# Patient Record
Sex: Female | Born: 1970 | Race: White | Hispanic: No | Marital: Married | Smoking: Never smoker
Health system: Southern US, Community
[De-identification: ages and names within clinical notes are randomized; demographics above are authoritative.]

## PROBLEM LIST (undated history)

## (undated) DIAGNOSIS — K219 Gastro-esophageal reflux disease without esophagitis: Secondary | ICD-10-CM

## (undated) DIAGNOSIS — N920 Excessive and frequent menstruation with regular cycle: Secondary | ICD-10-CM

## (undated) DIAGNOSIS — F419 Anxiety disorder, unspecified: Secondary | ICD-10-CM

## (undated) DIAGNOSIS — R053 Chronic cough: Secondary | ICD-10-CM

## (undated) DIAGNOSIS — G43909 Migraine, unspecified, not intractable, without status migrainosus: Secondary | ICD-10-CM

## (undated) HISTORY — PX: OTHER SURGICAL HISTORY: SHX169

## (undated) HISTORY — DX: Gastro-esophageal reflux disease without esophagitis: K21.9

## (undated) HISTORY — DX: Migraine, unspecified, not intractable, without status migrainosus: G43.909

---

## 2002-03-06 ENCOUNTER — Encounter: Payer: Self-pay | Admitting: Emergency Medicine

## 2002-03-06 ENCOUNTER — Emergency Department (HOSPITAL_COMMUNITY): Admission: EM | Admit: 2002-03-06 | Discharge: 2002-03-06 | Payer: Self-pay | Admitting: Emergency Medicine

## 2003-03-05 ENCOUNTER — Other Ambulatory Visit: Admission: RE | Admit: 2003-03-05 | Discharge: 2003-03-05 | Payer: Self-pay | Admitting: Obstetrics and Gynecology

## 2003-09-18 ENCOUNTER — Inpatient Hospital Stay (HOSPITAL_COMMUNITY): Admission: AD | Admit: 2003-09-18 | Discharge: 2003-09-20 | Payer: Self-pay | Admitting: Obstetrics and Gynecology

## 2003-09-19 ENCOUNTER — Encounter (INDEPENDENT_AMBULATORY_CARE_PROVIDER_SITE_OTHER): Payer: Self-pay | Admitting: Specialist

## 2003-09-21 ENCOUNTER — Encounter: Admission: RE | Admit: 2003-09-21 | Discharge: 2003-10-21 | Payer: Self-pay | Admitting: Obstetrics and Gynecology

## 2003-10-22 ENCOUNTER — Encounter: Admission: RE | Admit: 2003-10-22 | Discharge: 2003-11-21 | Payer: Self-pay | Admitting: Obstetrics and Gynecology

## 2003-10-24 ENCOUNTER — Other Ambulatory Visit: Admission: RE | Admit: 2003-10-24 | Discharge: 2003-10-24 | Payer: Self-pay | Admitting: Obstetrics and Gynecology

## 2007-08-28 ENCOUNTER — Encounter: Admission: RE | Admit: 2007-08-28 | Discharge: 2007-08-28 | Payer: Self-pay | Admitting: Allergy and Immunology

## 2010-08-12 ENCOUNTER — Encounter
Admission: RE | Admit: 2010-08-12 | Discharge: 2010-08-12 | Payer: Self-pay | Source: Home / Self Care | Attending: Obstetrics and Gynecology | Admitting: Obstetrics and Gynecology

## 2010-09-08 ENCOUNTER — Other Ambulatory Visit: Payer: Self-pay | Admitting: Obstetrics and Gynecology

## 2010-12-04 NOTE — H&P (Signed)
NAMESHAYNA, EBLEN NO.:  [id number]   MEDICAL RECORD NO.:  [id number]                   PATIENT TYPE:  INP   LOCATION:  9173                                 FACILITY:  [facility]   PHYSICIAN:  Lenoard Aden, M.D.             DATE OF BIRTH:  September 26, 1970   DATE OF ADMISSION:  09/18/2003  DATE OF DISCHARGE:                                HISTORY & PHYSICAL   CHIEF COMPLAINT:  Twins with discordant growth at term.   HISTORY OF PRESENT ILLNESS:  Patient is a 40 year old white female G2, P50,  EDD October 07, 2003 at 37 weeks with discordant twin growth and a known  dichorionic-diamniotic twin gestation with a vertex-vertex presenting for  induction.   FAMILY HISTORY:  Past family history remarkable for hypertension, lung and  colon cancer, and breast cancer.   ALLERGIES:  She has no known drug allergies.   MEDICATIONS:  Prenatal vitamins.   OBSTETRICAL HISTORY:  A 9-pound 10-ounce female born in 2001.   PRENATAL LABORATORY DATA:  Blood type of A positive, rubella immune,  hepatitis negative, VDRL nonreactive, GBS is negative.   COMPLICATIONS:  Pregnancy is complicated by twin gestation as previously  noted vertex-vertex presenting with worsening low back pain and some  discordance in growth although appropriate growth in twins are noted.   PHYSICAL EXAMINATION:  GENERAL:  She is a well-developed, well-nourished  white female in no acute distress.  HEENT:  Normal.  LUNGS:  Clear.  HEART:  Regular rate and rhythm.  ABDOMEN:  Soft, gravid, and nontender.  Estimated fetal weights are 7 pounds  on twin A and 6 pounds on twin B.  CERVIX:  Two centimeters, 50%, vertex, and -1.  EXTREMITIES:  No cords.  NEUROLOGIC:  Nonfocal.   IMPRESSION:  Thirty-seven-week twin intrauterine dichorionic/diamniotic  vertex-vertex gestation.   PLAN:  Proceed with induction.  Risks/benefits discussed.  The patient  acknowledges and wishes to proceed.                               Lenoard Aden, M.D.    RJT/MEDQ  D:  09/18/2003  T:  09/18/2003  Job:  [id number]

## 2011-08-24 ENCOUNTER — Ambulatory Visit: Payer: BC Managed Care – PPO | Admitting: Physician Assistant

## 2011-08-24 VITALS — BP 98/72 | HR 62 | Temp 98.7°F | Resp 16 | Ht 65.5 in | Wt 152.4 lb

## 2011-08-24 DIAGNOSIS — G43909 Migraine, unspecified, not intractable, without status migrainosus: Secondary | ICD-10-CM | POA: Insufficient documentation

## 2011-08-24 DIAGNOSIS — G43009 Migraine without aura, not intractable, without status migrainosus: Secondary | ICD-10-CM

## 2011-08-24 DIAGNOSIS — J019 Acute sinusitis, unspecified: Secondary | ICD-10-CM

## 2011-08-24 DIAGNOSIS — R509 Fever, unspecified: Secondary | ICD-10-CM

## 2011-08-24 MED ORDER — FLUTICASONE PROPIONATE 50 MCG/ACT NA SUSP: 2.0000 | Freq: Every day | NASAL | Status: DC

## 2011-08-24 MED ORDER — AMOXICILLIN 500 MG PO CAPS: ORAL_CAPSULE | ORAL | Status: DC

## 2011-08-24 NOTE — Patient Instructions (Signed)
Fluids and rest, Respiratory Care.  Tylenol or Advil if tolerated for fever and body aches. Saline nasal spray

## 2011-08-24 NOTE — Progress Notes (Signed)
  Subjective:    Patient ID: Monica Bennett, female    DOB: 12-05-1970, 41 y.o.   MRN: [id number]  HPI @[facility] @ Dr. Jaymes Graff Dr. Vela Prose is Neurologist 41 y.o. y/o CF presents with URI S/Sx  Onset 5 days ago Location Sinus pressure Duration: 5 days Severity: mild to moderate and worsening Quality/Character:  Pressure in face, Dull aching in upper mandible Exacerbating/Alleviating Factors: Advil  Associated Signs and Symptoms UPPER TEETH PAIN  Status of chronic disease:  Migraines have been more frequent  Smoker: no Alcohol/illicits: no PMH:  Migraines.  Review of Systems  Constitutional: Positive for fever (low grade) and chills.  HENT: Positive for congestion (green nasal drainage), facial swelling and postnasal drip. Negative for ear pain, nosebleeds, neck pain, tinnitus and ear discharge.   Eyes: Negative for photophobia, discharge and itching.  Respiratory: Positive for cough (very little). Negative for chest tightness.   Cardiovascular: Negative.   Gastrointestinal: Negative.   Genitourinary: Negative.   Musculoskeletal: Negative.   Neurological: Positive for headaches (sinus pain).  Psychiatric/Behavioral: Negative.        Objective:   Physical Exam  Nursing note and vitals reviewed. Constitutional: She is oriented to person, place, and time. She appears well-developed and well-nourished. No distress.  HENT:  Head: Normocephalic and atraumatic.  Mouth/Throat: No oropharyngeal exudate.       Turbinate inflammation and swelling  Eyes: Conjunctivae and EOM are normal. Pupils are equal, round, and reactive to light.  Neck: Normal range of motion. Neck supple. No thyromegaly present.  Cardiovascular: Normal rate, regular rhythm and normal heart sounds.  Exam reveals no gallop and no friction rub.   No murmur heard. Pulmonary/Chest: Effort normal and breath sounds normal.  Abdominal: Soft.  Lymphadenopathy:    She has no cervical adenopathy.  Neurological: She  is alert and oriented to person, place, and time. No cranial nerve deficit.  Skin: Skin is warm and dry. No rash noted.  Psychiatric: She has a normal mood and affect.          Assessment & Plan:   Sinusitis Exacerbation of Migraines-? Sinus inflammation See AVS.  Amoxicillin, Flonase, cold air humidifier.  Continue flonase after illness is resolved to see if it will reduce number of migraines and schedule appt with Dr. Vela Prose.

## 2011-09-17 ENCOUNTER — Other Ambulatory Visit: Payer: Self-pay | Admitting: Obstetrics and Gynecology

## 2011-09-17 DIAGNOSIS — Z1231 Encounter for screening mammogram for malignant neoplasm of breast: Secondary | ICD-10-CM

## 2011-09-27 ENCOUNTER — Ambulatory Visit
Admission: RE | Admit: 2011-09-27 | Discharge: 2011-09-27 | Disposition: A | Discharge status: Home / Self Care | Payer: BC Managed Care – PPO | Source: Ambulatory Visit | Attending: Obstetrics and Gynecology | Admitting: Obstetrics and Gynecology

## 2011-09-27 DIAGNOSIS — Z1231 Encounter for screening mammogram for malignant neoplasm of breast: Secondary | ICD-10-CM

## 2012-04-23 ENCOUNTER — Ambulatory Visit (INDEPENDENT_AMBULATORY_CARE_PROVIDER_SITE_OTHER): Payer: BC Managed Care – PPO | Admitting: Internal Medicine

## 2012-04-23 VITALS — BP 105/75 | HR 83 | Temp 98.2°F | Resp 16 | Ht 66.0 in | Wt 147.6 lb

## 2012-04-23 DIAGNOSIS — J029 Acute pharyngitis, unspecified: Secondary | ICD-10-CM

## 2012-04-23 DIAGNOSIS — R070 Pain in throat: Secondary | ICD-10-CM

## 2012-04-23 LAB — POCT RAPID STREP A (OFFICE): Rapid Strep A Screen: POSITIVE — AB

## 2012-04-23 MED ORDER — AMOXICILLIN 500 MG PO CAPS: 1000.0000 mg | ORAL_CAPSULE | Freq: Two times a day (BID) | ORAL | Status: DC

## 2012-04-23 MED ORDER — HYDROCODONE-ACETAMINOPHEN 7.5-500 MG/15ML PO SOLN: 5.0000 mL | Freq: Four times a day (QID) | ORAL | Status: DC | PRN

## 2012-04-23 NOTE — Progress Notes (Signed)
  Subjective:    Patient ID: Monica Bennett, female    DOB: 01/01/1971, 41 y.o.   MRN: [id number]  HPI Abrupt onset of st, no chest sxs Son has strep   Review of Systems     Objective:   Physical Exam Red swollen tonsils Lungs clear Nose clear  Results for orders placed in visit on 04/23/12  POCT RAPID STREP A (OFFICE)      Component Value Range   Rapid Strep A Screen Positive (*) Negative        Assessment & Plan:  Strep tonsillitis

## 2012-04-23 NOTE — Patient Instructions (Signed)

## 2012-06-02 ENCOUNTER — Ambulatory Visit: Payer: BC Managed Care – PPO | Admitting: Emergency Medicine

## 2012-06-02 VITALS — BP 142/82 | HR 70 | Temp 98.4°F | Resp 17 | Ht 67.0 in | Wt 152.0 lb

## 2012-06-02 DIAGNOSIS — G43909 Migraine, unspecified, not intractable, without status migrainosus: Secondary | ICD-10-CM

## 2012-06-02 DIAGNOSIS — R51 Headache: Secondary | ICD-10-CM

## 2012-06-02 MED ORDER — KETOROLAC TROMETHAMINE 60 MG/2ML IM SOLN
60.0000 mg | Freq: Once | INTRAMUSCULAR | Status: AC
  Administered 2012-06-02: 60 mg via INTRAMUSCULAR

## 2012-06-02 MED ORDER — ONDANSETRON 4 MG PO TBDP
4.0000 mg | ORAL_TABLET | Freq: Once | ORAL | Status: AC
  Administered 2012-06-02: 8 mg via ORAL

## 2012-06-02 NOTE — Patient Instructions (Addendum)
Migraine Headache A migraine headache is an intense, throbbing pain on one or both sides of your head. A migraine can last for 30 minutes to several hours. CAUSES  The exact cause of a migraine headache is not always known. However, a migraine may be caused when nerves in the brain become irritated and release chemicals that cause inflammation. This causes pain. SYMPTOMS  Pain on one or both sides of your head.  Pulsating or throbbing pain.  Severe pain that prevents daily activities.  Pain that is aggravated by any physical activity.  Nausea, vomiting, or both.  Dizziness.  Pain with exposure to bright lights, loud noises, or activity.  General sensitivity to bright lights, loud noises, or smells. Before you get a migraine, you may get warning signs that a migraine is coming (aura). An aura may include:  Seeing flashing lights.  Seeing bright spots, halos, or zig-zag lines.  Having tunnel vision or blurred vision.  Having feelings of numbness or tingling.  Having trouble talking.  Having muscle weakness. MIGRAINE TRIGGERS  Alcohol.  Smoking.  Stress.  Menstruation.  Aged cheeses.  Foods or drinks that contain nitrates, glutamate, aspartame, or tyramine.  Lack of sleep.  Chocolate.  Caffeine.  Hunger.  Physical exertion.  Fatigue.  Medicines used to treat chest pain (nitroglycerine), birth control pills, estrogen, and some blood pressure medicines. DIAGNOSIS  A migraine headache is often diagnosed based on:  Symptoms.  Physical examination.  A CT scan or MRI of your head. TREATMENT Medicines may be given for pain and nausea. Medicines can also be given to help prevent recurrent migraines.  HOME CARE INSTRUCTIONS  Only take over-the-counter or prescription medicines for pain or discomfort as directed by your caregiver. The use of long-term narcotics is not recommended.  Lie down in a dark, quiet room when you have a migraine.  Keep a journal  to find out what may trigger your migraine headaches. For example, write down:  What you eat and drink.  How much sleep you get.  Any change to your diet or medicines.  Limit alcohol consumption.  Quit smoking if you smoke.  Get 7 to 9 hours of sleep, or as recommended by your caregiver.  Limit stress.  Keep lights dim if bright lights bother you and make your migraines worse. SEEK IMMEDIATE MEDICAL CARE IF:   Your migraine becomes severe.  You have a fever.  You have a stiff neck.  You have vision loss.  You have muscular weakness or loss of muscle control.  You start losing your balance or have trouble walking.  You feel faint or pass out.  You have severe symptoms that are different from your first symptoms. MAKE SURE YOU:   Understand these instructions.  Will watch your condition.  Will get help right away if you are not doing well or get worse. Document Released: 07/05/2005 Document Revised: 09/27/2011 Document Reviewed: 06/25/2011 [employer] Patient Information 2013 [employer].  

## 2012-06-02 NOTE — Progress Notes (Signed)
  Subjective:    Patient ID: Monica Bennett, female    DOB: 21-Nov-1970, 41 y.o.   MRN: [id number]  HPI  41 y.o female presents with migraine that started the beginning of the month of November, progressively getting worse .  Has history of migraines in the past and normally resolves with Imitrex.  Onset due to hormonal reasons.  Has imitrex at her house but husband is out of town and she was weary of giving it to herself.  No nausea or vomiting.  Pain located bilateral temporals and going down both sides of neck.  Review of Systems     Objective:   Physical Exam pupils equal round regular react to light direct and consensual. Extraocular movements are intact. Cranial nerves II through XII are intact. The neck is supple. The chest is clear. Deep tendon reflexes are 2+ and symmetrical motor strength 5 out of 5 all muscle groups. The neck is supple.        Assessment & Plan:  Patient with a long history of hormonal induced migraines. Here with a three-day history of bitemporal headache not associated with much nausea. She's had significant nausea with headaches in the past. She normally takes an Imitrex injection but her husband is out of town so she was unable to administer this. Toradol and Zofran in the past

## 2012-10-05 ENCOUNTER — Ambulatory Visit: Payer: BC Managed Care – PPO | Admitting: Family Medicine

## 2012-10-05 VITALS — BP 116/84 | HR 76 | Temp 98.0°F | Resp 16 | Ht 66.0 in | Wt 151.0 lb

## 2012-10-05 DIAGNOSIS — G43909 Migraine, unspecified, not intractable, without status migrainosus: Secondary | ICD-10-CM

## 2012-10-05 MED ORDER — ONDANSETRON 4 MG PO TBDP
8.0000 mg | ORAL_TABLET | Freq: Once | ORAL | Status: AC
  Administered 2012-10-05: 8 mg via ORAL

## 2012-10-05 MED ORDER — KETOROLAC TROMETHAMINE 60 MG/2ML IM SOLN
60.0000 mg | Freq: Once | INTRAMUSCULAR | Status: AC
  Administered 2012-10-05: 60 mg via INTRAMUSCULAR

## 2012-10-05 MED ORDER — ONDANSETRON HCL 8 MG PO TABS: 8.0000 mg | ORAL_TABLET | Freq: Three times a day (TID) | ORAL | Status: DC | PRN

## 2012-10-05 NOTE — Patient Instructions (Signed)
Let us know if your headache comes back- feel free to call or come back in if needed

## 2012-10-05 NOTE — Progress Notes (Signed)
[facility] [address] [place] [address] [phone]  Date:  10/05/2012   Name:  Monica Bennett   DOB:  1971/07/04   MRN:  [id number]  PCP:  Anne Ng    Chief Complaint: Migraine   History of Present Illness:  Monica Bennett is a 42 y.o. very pleasant female patient who presents with the following:  Here today with complaint of migraine HA.  Last here in November 2013 with complaint of migraine.  She was treated with zofran and a toradol injection.  Tends to have migraine HA prior to her menses which are due in a few days.  Her current HA started today.  Her HA is located in the frontal part of her head, and in her neck.  She does have nausea, but no vomiting as of yet.   She does not get aura. She notes mild blurred vison, but does not have her glasses on right now and thinks this is the cause.   At her last visit the toradol did help She took some sort of rx medication earlier today- caled Dr. Clarisse Gouge, it was parafon forte, which she uses prn for migraine HA.  She has not taken any other medications or NSAIDs today  She has not had any zofran yet.  She does have phenergan at home but has not taken it- it makes her too sleepy.  She would like to have some zofran for home if possible Her LMP was 09/11/12.  She brought herself here today- her husband is OOT, so he was not here to give her her imitrex shots.   Patient Active Problem List  Diagnosis  . Migraine    Past Medical History  Diagnosis Date  . Migraines     No past surgical history on file.  History  Substance Use Topics  . Smoking status: Never Smoker   . Smokeless tobacco: Not on file  . Alcohol Use: No    Family History  Problem Relation Age of Onset  . Cancer Maternal Grandmother   . Heart disease Maternal Grandfather   . Cancer Paternal Grandmother   . Cancer Paternal Grandfather     No Known Allergies  Medication list has been reviewed and updated.  Current  Outpatient Prescriptions on File Prior to Visit  Medication Sig Dispense Refill  . calcium carbonate (OS-CAL) 1250 MG chewable tablet Chew 1 tablet by mouth daily.      Marland Kitchen topiramate (TOPAMAX) 25 MG tablet Take 75 mg by mouth 3 (three) times daily.       Marland Kitchen amoxicillin (AMOXIL) 500 MG capsule Take 2 po bid  40 capsule  0  . amoxicillin (AMOXIL) 500 MG capsule Take 2 capsules (1,000 mg total) by mouth 2 (two) times daily.  40 capsule  0  . fluticasone (FLONASE) 50 MCG/ACT nasal spray Place 2 sprays into the nose daily.  16 g  6  . HYDROcodone-acetaminophen (LORTAB) 7.5-500 MG/15ML solution Take 5 mLs by mouth every 6 (six) hours as needed for pain.  240 mL  0   No current facility-administered medications on file prior to visit.    Review of Systems:  As per HPI- otherwise negative. She describes this as a typical migraine HA today  Physical Examination: Filed Vitals:   10/05/12 0811  BP: 116/84  Pulse: 76  Temp: 98 F (36.7 C)  Resp: 16   Filed Vitals:   10/05/12 0811  Height: 5\' 6"  (1.676 m)  Weight: 151 lb (  68.493 kg)   Body mass index is 24.38 kg/(m^2). Ideal Body Weight: Weight in (lb) to have BMI = 25: 154.6  GEN: WDWN, NAD, Non-toxic, A & O x 3, looks miserable prior to treatment.   HEENT: Atraumatic, Normocephalic. Neck supple. No masses, No LAD. Bilateral TM wnl, oropharynx normal.  PEERL,EOMI.   Ears and Nose: No external deformity. CV: RRR, No M/G/R. No JVD. No thrill. No extra heart sounds. PULM: CTA B, no wheezes, crackles, rhonchi. No retractions. No resp. distress. No accessory muscle use. ABD: S, NT, ND, +BS. No rebound. No HSM. EXTR: No c/c/e NEURO Normal gait. Normal strength, sensation and DTR of all extremities.  No facial droop.  Negative arm drift test.  PSYCH: Normally interactive. Conversant. Not depressed or anxious appearing.  Calm demeanor.   Cohen felt a lot better after her zofran and toradol IM.  Her pain was reduced and she felt able to drive  herself home.  She appeared much better and to be out of pain  Assessment and Plan: Migraine headache - Plan: ondansetron (ZOFRAN-ODT) disintegrating tablet 8 mg, ketorolac (TORADOL) injection 60 mg, ondansetron (ZOFRAN) 8 MG tablet  Recurrent migraine HA, treated as above with zofran and toradol.  Gave an rx for zofran for her to take home as well.  She will call or come back in if her HA does not continue to remit or if it comes back- Sooner if worse.     Signed Abbe Amsterdam, MD

## 2012-10-25 ENCOUNTER — Other Ambulatory Visit: Payer: Self-pay

## 2012-10-25 DIAGNOSIS — Z1231 Encounter for screening mammogram for malignant neoplasm of breast: Secondary | ICD-10-CM

## 2012-11-28 ENCOUNTER — Ambulatory Visit
Admission: RE | Admit: 2012-11-28 | Discharge: 2012-11-28 | Disposition: A | Discharge status: Home / Self Care | Payer: BC Managed Care – PPO | Source: Ambulatory Visit

## 2012-11-28 DIAGNOSIS — Z1231 Encounter for screening mammogram for malignant neoplasm of breast: Secondary | ICD-10-CM

## 2014-04-17 ENCOUNTER — Ambulatory Visit (INDEPENDENT_AMBULATORY_CARE_PROVIDER_SITE_OTHER): Payer: BC Managed Care – PPO | Admitting: Family Medicine

## 2014-04-17 VITALS — BP 120/78 | HR 77 | Temp 97.8°F | Resp 16 | Ht 66.0 in | Wt 160.2 lb

## 2014-04-17 DIAGNOSIS — K219 Gastro-esophageal reflux disease without esophagitis: Secondary | ICD-10-CM

## 2014-04-17 MED ORDER — ESOMEPRAZOLE MAGNESIUM 40 MG PO CPDR: DELAYED_RELEASE_CAPSULE | ORAL | Status: DC

## 2014-04-17 NOTE — Patient Instructions (Signed)
We will change your PPI to a stronger form (esomeprazole) to take once daily for 14 days, then as needed. If your symptoms persist, I would follow-up with your Gastroenterologist or here at our clinic for consideration of H. Pylori testing and/or EGD (a camera to look at the lining of the stomach). If you begin to have vomiting, coffee ground vomiting, fever, chills, dizziness, black tarry stools, then return to clinic immediately or go to the ED.  Gastroesophageal Reflux Disease, Adult Gastroesophageal reflux disease (GERD) happens when acid from your stomach flows up into the esophagus. When acid comes in contact with the esophagus, the acid causes soreness (inflammation) in the esophagus. Over time, GERD may create small holes (ulcers) in the lining of the esophagus. CAUSES   Increased body weight. This puts pressure on the stomach, making acid rise from the stomach into the esophagus.  Smoking. This increases acid production in the stomach.  Drinking alcohol. This causes decreased pressure in the lower esophageal sphincter (valve or ring of muscle between the esophagus and stomach), allowing acid from the stomach into the esophagus.  Late evening meals and a full stomach. This increases pressure and acid production in the stomach.  A malformed lower esophageal sphincter. Sometimes, no cause is found. SYMPTOMS   Burning pain in the lower part of the mid-chest behind the breastbone and in the mid-stomach area. This may occur twice a week or more often.  Trouble swallowing.  Sore throat.  Dry cough.  Asthma-like symptoms including chest tightness, shortness of breath, or wheezing. DIAGNOSIS  Your caregiver may be able to diagnose GERD based on your symptoms. In some cases, X-rays and other tests may be done to check for complications or to check the condition of your stomach and esophagus. TREATMENT  Your caregiver may recommend over-the-counter or prescription medicines to help  decrease acid production. Ask your caregiver before starting or adding any new medicines.  HOME CARE INSTRUCTIONS   Change the factors that you can control. Ask your caregiver for guidance concerning weight loss, quitting smoking, and alcohol consumption.  Avoid foods and drinks that make your symptoms worse, such as:  Caffeine or alcoholic drinks.  Chocolate.  Peppermint or mint flavorings.  Garlic and onions.  Spicy foods.  Citrus fruits, such as oranges, lemons, or limes.  Tomato-based foods such as sauce, chili, salsa, and pizza.  Fried and fatty foods.  Avoid lying down for the 3 hours prior to your bedtime or prior to taking a nap.  Eat small, frequent meals instead of large meals.  Wear loose-fitting clothing. Do not wear anything tight around your waist that causes pressure on your stomach.  Raise the head of your bed 6 to 8 inches with wood blocks to help you sleep. Extra pillows will not help.  Only take over-the-counter or prescription medicines for pain, discomfort, or fever as directed by your caregiver.  Do not take aspirin, ibuprofen, or other nonsteroidal anti-inflammatory drugs (NSAIDs). SEEK IMMEDIATE MEDICAL CARE IF:   You have pain in your arms, neck, jaw, teeth, or back.  Your pain increases or changes in intensity or duration.  You develop nausea, vomiting, or sweating (diaphoresis).  You develop shortness of breath, or you faint.  Your vomit is green, yellow, black, or looks like coffee grounds or blood.  Your stool is red, bloody, or black. These symptoms could be signs of other problems, such as heart disease, gastric bleeding, or esophageal bleeding. MAKE SURE YOU:   Understand these instructions.  Will watch your condition.  Will get help right away if you are not doing well or get worse. Document Released: 04/14/2005 Document Revised: 09/27/2011 Document Reviewed: 01/22/2011 [facility] Patient Information 2015 [place], [place]. This  information is not intended to replace advice given to you by your health care provider. Make sure you discuss any questions you have with your health care provider.

## 2014-04-17 NOTE — Progress Notes (Signed)
   Subjective:    Patient ID: Monica Bennett, female    DOB: 10-11-1970, 43 y.o.   MRN: [id number]  HPI Monica Bennett is a 43 year old female who presents with chronic cough and sore throat. Onset of symptoms was about 1 month ago. Character of cough is dry, non-productive. Little relief noted with vinegar, honey, OTC anti-reflux medications- prilosec 20mg  once daily. Aggravated with stress and carbonated foods, laying down. Recent hx of a cold 3 weeks ago. Hx of GERD. She is UTD on colonoscopy, but due for a 5 yr follow-up for polyps soon.  Dietary Habits: Tries to avoid trigger foods, limit coffee and soda.  ROS: +Occasional nausea, but no vomiting +GERD No melanotic stools or change in stools No sinus pressure No rhinitis No Wheezing No SOB No Sneezing No Allergies -->saw allergist, no allergies No Swelling No Chest Pain No Fatigue No Unexplained Wt loss   SocHx: Works a Designer, multimedia. Likes to bike and walk (no exercise intolerance). No tobacco use. Occ alcohol use FamHx: Brother with GERD as well-> has had EGD which was normal.  Review of Systems 11 point review of systems was performed and was otherwise negative unless noted in the history of present illness.    Objective:   Physical Exam BP 120/78  Pulse 77  Temp(Src) 97.8 F (36.6 C) (Oral)  Resp 16  Ht 5\' 6"  (1.676 m)  Wt 160 lb 3.2 oz (72.666 kg)  BMI 25.87 kg/m2  SpO2 98%  LMP 04/04/2014 General appearance: alert, cooperative and appears stated age Head: Normocephalic, without obvious abnormality, atraumatic Eyes: conjunctivae/corneas clear. PERRL, EOM's intact. Fundi benign., no icterus or pallor Ears: normal TM's and external ear canals both ears Nose: Nares normal. Septum midline. Mucosa normal. No drainage or sinus tenderness. Throat: mild posterior pharyngeal erythema, no acid signs on teeth, no tonsillar exudates Neck: no adenopathy, no carotid bruit, no JVD, supple, symmetrical, trachea midline and thyroid not  enlarged, symmetric, no tenderness/mass/nodules Lungs: clear to auscultation bilaterally and normal percussion bilaterally Heart: regular rate and rhythm, S1, S2 normal, no murmur, click, rub or gallop Abdomen: soft, non-tender; bowel sounds normal; no masses,  no organomegaly Skin: Skin color, texture, turgor normal. No rashes or lesions Lymph nodes: Cervical, supraclavicular, and axillary nodes normal.    Assessment & Plan:  1. GERD  -Change PPI to a stronger form (esomeprazole) to take once daily for 14 days, then as needed. -If symptoms persist, I would recommend she follow-up with her Gastroenterologist or here at our clinic for consideration of H. Pylori testing and/or EGD (a camera to look at the lining of the stomach). -If you begin to have vomiting, coffee ground vomiting, fever, chills, dizziness, black tarry stools, then return to clinic immediately or go to the ED. Patient verbalized understanding and agreement.  Dr. Linnell Fulling, DO Sports Medicine Fellow

## 2014-07-23 ENCOUNTER — Other Ambulatory Visit: Payer: Self-pay

## 2014-07-23 DIAGNOSIS — Z1231 Encounter for screening mammogram for malignant neoplasm of breast: Secondary | ICD-10-CM

## 2014-08-09 ENCOUNTER — Ambulatory Visit: Payer: Self-pay

## 2014-08-23 ENCOUNTER — Ambulatory Visit: Payer: Self-pay

## 2015-05-30 ENCOUNTER — Ambulatory Visit (INDEPENDENT_AMBULATORY_CARE_PROVIDER_SITE_OTHER): Payer: Self-pay | Admitting: Family Medicine

## 2015-05-30 VITALS — BP 116/76 | HR 78 | Temp 98.6°F | Resp 18 | Ht 66.0 in | Wt 171.0 lb

## 2015-05-30 DIAGNOSIS — R05 Cough: Secondary | ICD-10-CM

## 2015-05-30 DIAGNOSIS — R059 Cough, unspecified: Secondary | ICD-10-CM

## 2015-05-30 DIAGNOSIS — K219 Gastro-esophageal reflux disease without esophagitis: Secondary | ICD-10-CM

## 2015-05-30 MED ORDER — HYDROCOD POLST-CPM POLST ER 10-8 MG/5ML PO SUER: 5.0000 mL | Freq: Two times a day (BID) | ORAL | Status: DC | PRN

## 2015-05-30 NOTE — Progress Notes (Signed)
@[facility]FCLOGO @  This chart was scribed for Monica Haber, MD by Monica Bennett, ED Scribe. This patient was seen in room 3 and the patient's care was started at 8:16 AM.  Patient ID: Monica Bennett MRN: [id number], DOB: 1970-12-19, 44 y.o. Date of Encounter: 05/30/2015, 8:16 AM  Primary Physician: Mikey Kirschner  Chief Complaint:  Chief Complaint  Patient presents with   Cough    has appt with gastro in 2 weeks    Gastroesophageal Reflux    HPI: 44 y.o. year old female with history below presents with cough from GERD. States she is usually able to get cough under control but has been coughing for 6 weeks. Cough was more productive with phlegm a couple days ago. Symptoms are worse at night and with talking. She has tried OTC cough syrup without relief. She also takes nexium 20mg  BID, and takes one at least once a day. Has an appointment with Gertie Fey, Dr. Benson Norway in 2 weeks.  Pt works at [employer].   Past Medical History  Diagnosis Date   Migraines    GERD (gastroesophageal reflux disease)      Home Meds: Prior to Admission medications   Medication Sig Start Date End Date Taking? Authorizing Provider  calcium carbonate (OS-CAL) 1250 MG chewable tablet Chew 1 tablet by mouth daily.   Yes Historical Provider, MD  esomeprazole (NEXIUM) 40 MG capsule Take once daily for 14 days, then as needed. 04/17/14  Yes John C Pick-Jacobs, DO  levonorgestrel (MIRENA) 20 MCG/24HR IUD 1 each by Intrauterine route once.   Yes Historical Provider, MD  chlorzoxazone (PARAFON) 500 MG tablet Take 500 mg by mouth 4 (four) times daily as needed for muscle spasms (as needed for muscle spasm).    Historical Provider, MD  topiramate (TOPAMAX) 100 MG tablet Take 100 mg by mouth daily.    Historical Provider, MD  topiramate (TOPAMAX) 25 MG tablet Take 75 mg by mouth 3 (three) times daily.     Historical Provider, MD    Allergies: No Known Allergies  Social History   Social History   Marital  Status: Married    Spouse Name: N/A   Number of Children: N/A   Years of Education: N/A   Occupational History   Not on file.   Social History Main Topics   Smoking status: Never Smoker    Smokeless tobacco: Not on file   Alcohol Use: No   Drug Use: No   Sexual Activity: Yes    Museum/gallery curator: None   Other Topics Concern   Not on file   Social History Narrative    Review of Systems: Constitutional: negative for chills, fever, night sweats, weight changes, or fatigue  HEENT: negative for vision changes, hearing loss, congestion, rhinorrhea, ST, epistaxis, or sinus pressure Cardiovascular: negative for chest pain or palpitations Respiratory: negative for hemoptysis, wheezing or shortness of breath. Abdominal: negative for abdominal pain, nausea, vomiting, diarrhea, or constipation Dermatological: negative for rash Neurologic: negative for headache, dizziness, or syncope All other systems reviewed and are otherwise negative with the exception to those above and in the HPI.   Physical Exam: Blood pressure 116/76, pulse 78, temperature 98.6 F (37 C), temperature source Oral, resp. rate 18, weight 171 lb (77.565 kg), SpO2 98 %., Body mass index is 27.61 kg/(m^2). General: Well developed, well nourished, in no acute distress. Head: Normocephalic, atraumatic, eyes without discharge, sclera non-icteric, nares are without discharge. Bilateral auditory canals clear, TM's are without perforation, pearly grey and  translucent with reflective cone of light bilaterally. Oral cavity moist, posterior pharynx without exudate, erythema, peritonsillar abscess, or post nasal drip.  Neck: Supple. No thyromegaly. Full ROM. No lymphadenopathy. Lungs: Clear bilaterally to auscultation without wheezes, rales, or rhonchi. Breathing is unlabored. Heart: RRR with S1 S2. No murmurs, rubs, or gallops appreciated. Abdomen: Soft, non-tender, non-distended with normoactive bowel sounds. No  hepatomegaly. No rebound/guarding. No obvious abdominal masses. Msk:  Strength and tone normal for age. Extremities/Skin: Warm and dry. No clubbing or cyanosis. No edema. No rashes or suspicious lesions. Neuro: Alert and oriented X 3. Moves all extremities spontaneously. Gait is normal. CNII-XII grossly in tact. Psych:  Responds to questions appropriately with a normal affect.    ASSESSMENT AND PLAN:  44 y.o. year old female with  This chart was scribed in my presence and reviewed by me personally.    ICD-9-CM ICD-10-CM   1. Cough 786.2 R05 chlorpheniramine-HYDROcodone (TUSSIONEX PENNKINETIC ER) 10-8 MG/5ML SUER  2. Gastroesophageal reflux disease without esophagitis 530.81 K21.9 chlorpheniramine-HYDROcodone (TUSSIONEX PENNKINETIC ER) 10-8 MG/5ML SUER   The initial illness precipitated reactive airways and with the patient's history of cough related GERD, continued. She'll continue the Nexium and add a cough medicine at night for now.     By signing my name below, I, Raven Small, attest that this documentation has been prepared under the direction and in the presence of Monica Haber, MD.  Electronically Signed: Thea Bennett, ED Scribe. 05/30/2015. 8:24 AM.  Signed, Monica Haber, MD 05/30/2015 8:16 AM

## 2015-05-30 NOTE — Patient Instructions (Signed)

## 2015-11-22 DIAGNOSIS — Z9289 Personal history of other medical treatment: Secondary | ICD-10-CM | POA: Diagnosis not present

## 2015-11-22 DIAGNOSIS — J029 Acute pharyngitis, unspecified: Secondary | ICD-10-CM | POA: Diagnosis not present

## 2016-02-16 DIAGNOSIS — M67911 Unspecified disorder of synovium and tendon, right shoulder: Secondary | ICD-10-CM | POA: Diagnosis not present

## 2016-05-19 DIAGNOSIS — Z23 Encounter for immunization: Secondary | ICD-10-CM | POA: Diagnosis not present

## 2016-10-06 DIAGNOSIS — Z713 Dietary counseling and surveillance: Secondary | ICD-10-CM | POA: Diagnosis not present

## 2016-12-01 DIAGNOSIS — Z713 Dietary counseling and surveillance: Secondary | ICD-10-CM | POA: Diagnosis not present

## 2017-03-17 DIAGNOSIS — J029 Acute pharyngitis, unspecified: Secondary | ICD-10-CM | POA: Diagnosis not present

## 2017-03-17 DIAGNOSIS — Z9289 Personal history of other medical treatment: Secondary | ICD-10-CM | POA: Diagnosis not present

## 2017-05-28 DIAGNOSIS — Z23 Encounter for immunization: Secondary | ICD-10-CM | POA: Diagnosis not present

## 2017-09-30 ENCOUNTER — Encounter: Payer: Self-pay | Admitting: Physician Assistant

## 2017-09-30 ENCOUNTER — Other Ambulatory Visit: Payer: Self-pay

## 2017-09-30 ENCOUNTER — Ambulatory Visit (INDEPENDENT_AMBULATORY_CARE_PROVIDER_SITE_OTHER): Payer: BLUE CROSS/BLUE SHIELD | Admitting: Physician Assistant

## 2017-09-30 VITALS — BP 110/86 | HR 79 | Temp 98.4°F | Resp 16 | Ht 66.0 in | Wt 147.0 lb

## 2017-09-30 DIAGNOSIS — R112 Nausea with vomiting, unspecified: Secondary | ICD-10-CM | POA: Diagnosis not present

## 2017-09-30 DIAGNOSIS — G43011 Migraine without aura, intractable, with status migrainosus: Secondary | ICD-10-CM

## 2017-09-30 MED ORDER — KETOROLAC TROMETHAMINE 60 MG/2ML IM SOLN
60.0000 mg | Freq: Once | INTRAMUSCULAR | Status: AC
  Administered 2017-09-30: 60 mg via INTRAMUSCULAR

## 2017-09-30 MED ORDER — ONDANSETRON 8 MG PO TBDP: 8.0000 mg | ORAL_TABLET | Freq: Three times a day (TID) | ORAL | 0 refills | Status: DC | PRN

## 2017-09-30 MED ORDER — ONDANSETRON HCL 4 MG/2ML IJ SOLN
2.0000 mg | Freq: Once | INTRAMUSCULAR | Status: AC
  Administered 2017-09-30: 2 mg via INTRAMUSCULAR

## 2017-09-30 MED ORDER — PROMETHAZINE HCL 25 MG/ML IJ SOLN: 50.0000 mg | Freq: Once | INTRAMUSCULAR | Status: DC

## 2017-09-30 MED ORDER — SUMATRIPTAN 5 MG/ACT NA SOLN
1.0000 | NASAL | 0 refills | Status: DC | PRN
Start: 2017-09-30 — End: 2020-09-17

## 2017-09-30 NOTE — Progress Notes (Signed)
Monica Bennett  MRN: [id number] DOB: [birth year]  PCP: Patient, No Pcp Per  Subjective:  Pt is a 47 year old female PMH GERD and migraines who presents to clinic for migraine x 1 day. She is here today with her husband.  H/o migraines - hormonally induced. She used to have migraines several times a year. Last episode of migraine was about 1 year ago. Imitrex nasal spray worked very well for her. Her Rx has expired.  She endorses many recent increased life stressors. She worked out really hard yesterday. "I could be doing better taking care of myself"   She dose not takes topamax anymore.   Review of Systems  Eyes: Negative for photophobia and visual disturbance.  Gastrointestinal: Positive for nausea and vomiting. Negative for abdominal pain.  Neurological: Positive for dizziness and headaches. Negative for syncope and weakness.    Patient Active Problem List   Diagnosis Date Noted  . GERD (gastroesophageal reflux disease) 05/30/2015  . Migraine 08/24/2011    Current Outpatient Medications on File Prior to Visit  Medication Sig Dispense Refill  . levonorgestrel (MIRENA) 20 MCG/24HR IUD 1 each by Intrauterine route once.    . calcium carbonate (OS-CAL) 1250 MG chewable tablet Chew 1 tablet by mouth daily.    . chlorzoxazone (PARAFON) 500 MG tablet Take 500 mg by mouth 4 (four) times daily as needed for muscle spasms (as needed for muscle spasm).    Marland Kitchen esomeprazole (NEXIUM) 40 MG capsule Take once daily for 14 days, then as needed. (Patient not taking: Reported on 09/30/2017) 30 capsule 1  . topiramate (TOPAMAX) 100 MG tablet Take 100 mg by mouth daily.    Marland Kitchen topiramate (TOPAMAX) 25 MG tablet Take 75 mg by mouth 3 (three) times daily.      No current facility-administered medications on file prior to visit.     No Known Allergies   Objective:  BP 110/86   Pulse 79   Temp 98.4 F (36.9 C) (Oral)   Resp 16   Ht 5\' 6"  (1.676 m)   Wt 147 lb (66.7 kg)   SpO2 98%   BMI 23.73  kg/m   Physical Exam  Constitutional: She is oriented to person, place, and time and well-developed, well-nourished, and in no distress. Vital signs are normal. No distress.  Pt laying on the exam table, head in arms, eyes closed, holding emesis bucket.   Eyes: Pupils are equal, round, and reactive to light. Conjunctivae are normal.  Cardiovascular: Normal rate, regular rhythm and normal heart sounds.  Neurological: She is alert and oriented to person, place, and time. She has normal motor skills and intact cranial nerves. GCS score is 15.  Skin: Skin is warm and dry.  Psychiatric: Mood, memory, affect and judgment normal.  Vitals reviewed.   Assessment and Plan :  1. Intractable migraine without aura and with status migrainosus - ketorolac (TORADOL) injection 60 mg - PR NORMAL SALINE SOLUTION INFUS - PR CATH IMPL VASC ACCESS PORTAL - SUMAtriptan (IMITREX) 5 MG/ACT nasal spray; Place 1-2 sprays (5-10 mg total) into the nose every 2 (two) hours as needed for migraine. Max daily dose 40 mg  Dispense: 1 Inhaler; Refill: 0 - Pt presents for migraine with nausea. Pt endorses 90% relief following IVF and Toradol injection. OK to refill Imitrex nasal spray. RTC if symptoms recur.  2. Nausea and vomiting, intractability of vomiting not specified, unspecified vomiting type - ondansetron (ZOFRAN) injection 2 mg - Insert peripheral IV - ondansetron (ZOFRAN-ODT)  8 MG disintegrating tablet; Take 1 tablet (8 mg total) by mouth every 8 (eight) hours as needed for nausea.  Dispense: 12 tablet; Refill: 0   Mercer Pod, PA-C  [facility] 09/30/2017 3:44 PM

## 2017-09-30 NOTE — Patient Instructions (Addendum)
Imitrex 25m nasal spray.  A 10 mg dose may be achieved by administering a single 5 mg dose in each nostril. If headache has not resolved within 2 hours or returns, the dose may be repeated once after 2 hours, not to exceed a total daily dose of 40 mg.  Your pharmacy did not have this medication on file. I am starting you at 5-116m Please let me know if you need a higher starting dose.   Thank you for coming in today. I hope you feel we met your needs. Feel free to call PCP if you have any questions or further requests. Please consider signing up for MyChart if you do not already have it, as this is a great way to communicate with me.  Best,  Whitney McVey, PA-C  Sumatriptan nasal spray What is this medicine? SUMATRIPTAN (soo ma TRIP tan) is used to treat migraines with or without aura. An aura is a strange feeling or visual disturbance that warns you of an attack. It is not used to prevent migraines. This medicine may be used for other purposes; ask your health care provider or pharmacist if you have questions. COMMON BRAND NAME(S): Imitrex What should I tell my health care provider before I take this medicine? They need to know if you have any of these conditions: -circulation problems in fingers and toes -diabetes -heart disease -high blood pressure -high cholesterol -history of irregular heartbeat -history of stroke -kidney disease -liver disease -postmenopausal or surgical removal of uterus and ovaries -seizures -smoke tobacco -stomach or intestine problems -an unusual or allergic reaction to sumatriptan, other medicines, foods, dyes, or preservatives -pregnant or trying to get pregnant -breast-feeding How should I use this medicine? This medicine is for use in the nose. Follow the directions on the prescription label. This medicine is taken at the first symptoms of a migraine. It is not for everyday use. Do not take your medicine more often than directed. Talk to your  pediatrician regarding the use of this medicine in children. Special care may be needed. Overdosage: If you think you have taken too much of this medicine contact a poison control center or emergency room at once. NOTE: This medicine is only for you. Do not share this medicine with others. What if I miss a dose? This does not apply; this medicine is not for regular use. What may interact with this medicine? Do not take this medicine with any of the following medicines: -cocaine -ergot alkaloids like dihydroergotamine, ergonovine, ergotamine, methylergonovine -feverfew -MAOIs like Carbex, Eldepryl, Marplan, Nardil, and Parnate -other medicines for migraine headache like almotriptan, eletriptan, frovatriptan, naratriptan, rizatriptan, zolmitriptan -tryptophan This medicine may also interact with the following medications: -certain medicines for depression, anxiety, or psychotic disturbances This list may not describe all possible interactions. Give your health care provider a list of all the medicines, herbs, non-prescription drugs, or dietary supplements you use. Also tell them if you smoke, drink alcohol, or use illegal drugs. Some items may interact with your medicine. What should I watch for while using this medicine? Only take this medicine for a migraine headache. Take it if you get warning symptoms or at the start of a migraine attack. It is not for regular use to prevent migraine attacks. You may get drowsy or dizzy. Do not drive, use machinery, or do anything that needs mental alertness until you know how this medicine affects you. To reduce dizzy or fainting spells, do not sit or stand up quickly, especially if you are  an older patient. Alcohol can increase drowsiness, dizziness and flushing. Avoid alcoholic drinks. Smoking cigarettes may increase the risk of heart-related side effects from using this medicine. If you take migraine medicines for 10 or more days a month, your migraines may  get worse. Keep a diary of headache days and medicine use. Contact your healthcare professional if your migraine attacks occur more frequently. What side effects may I notice from receiving this medicine? Side effects that you should report to your doctor or health care professional as soon as possible: -allergic reactions like skin rash, itching or hives, swelling of the face, lips, or tongue -bloody or watery diarrhea -hallucination, loss of contact with reality -pain, tingling, numbness in the face, hands, or feet -seizures -signs and symptoms of a blood clot such as breathing problems; changes in vision; chest pain; severe, sudden headache; pain, swelling, warmth in the leg; trouble speaking; sudden numbness or weakness of the face, arm, or leg -signs and symptoms of a dangerous change in heartbeat or heart rhythm like chest pain; dizziness; fast or irregular heartbeat; palpitations, feeling faint or lightheaded; falls; breathing problems -signs and symptoms of a stroke like changes in vision; confusion; trouble speaking or understanding; severe headaches; sudden numbness or weakness of the face, arm, or leg; trouble walking; dizziness; loss of balance or coordination -stomach pain Side effects that usually do not require medical attention (report to your doctor or health care professional if they continue or are bothersome): -changes in taste -facial flushing -headache -muscle cramps -muscle pain -nausea, vomiting -weak or tired This list may not describe all possible side effects. Call your doctor for medical advice about side effects. You may report side effects to FDA at 1-800-FDA-1088. Where should I keep my medicine? Keep out of the reach of children. Store at room temperature between 2 and 30 degrees C (36 and 86 degrees F). Throw away any unused medicine after the expiration date. NOTE: This sheet is a summary. It may not cover all possible information. If you have questions about  this medicine, talk to your doctor, pharmacist, or health care provider.  2018 Elsevier/Gold Standard (2015-08-07 12:43:05)  IF you received an x-ray today, you will receive an invoice from [facility]. Please contact [facility] at [phone] with questions or concerns regarding your invoice.   IF you received labwork today, you will receive an invoice from [facility]. Please contact [facility] at [phone] with questions or concerns regarding your invoice.   Our billing staff will not be able to assist you with questions regarding bills from these companies.  You will be contacted with the lab results as soon as they are available. The fastest way to get your results is to activate your My Chart account. Instructions are located on the last page of this paperwork. If you have not heard from [place] regarding the results in 2 weeks, please contact this office.

## 2017-11-18 ENCOUNTER — Encounter: Payer: Self-pay | Admitting: Physician Assistant

## 2017-11-18 ENCOUNTER — Ambulatory Visit (INDEPENDENT_AMBULATORY_CARE_PROVIDER_SITE_OTHER): Payer: BLUE CROSS/BLUE SHIELD | Admitting: Physician Assistant

## 2017-11-18 ENCOUNTER — Other Ambulatory Visit: Payer: Self-pay

## 2017-11-18 VITALS — BP 124/88 | HR 59 | Temp 98.6°F | Resp 16 | Ht 66.0 in | Wt 156.0 lb

## 2017-11-18 DIAGNOSIS — G43901 Migraine, unspecified, not intractable, with status migrainosus: Secondary | ICD-10-CM | POA: Diagnosis not present

## 2017-11-18 DIAGNOSIS — G43011 Migraine without aura, intractable, with status migrainosus: Secondary | ICD-10-CM | POA: Diagnosis not present

## 2017-11-18 MED ORDER — KETOROLAC TROMETHAMINE 60 MG/2ML IM SOLN
60.0000 mg | Freq: Once | INTRAMUSCULAR | Status: AC
  Administered 2017-11-18: 60 mg via INTRAMUSCULAR

## 2017-11-18 MED ORDER — KETOROLAC TROMETHAMINE 15.75 MG/SPRAY NA SOLN: 15.7500 mg | Freq: Four times a day (QID) | NASAL | 5 refills | Status: DC | PRN

## 2017-11-18 NOTE — Progress Notes (Signed)
Patient ID: Monica Bennett, female    DOB: 1971/05/09, 47 y.o.   MRN: [id number]  PCP: Patient, No Pcp Per  Chief Complaint  Patient presents with  . Migraine    started at 3am     Subjective:   Presents for evaluation of migraine headache.  This is her typical migraine, without any atypical features.  Bitemporal, bilateral neck. Associated nausea/vomiting improved with ondansetron at 8 am. No photophobia. It began this morning about 3 am, though in retrospect, she thinks that it was developing yesterday afternoon. Imitrex nasal spray at 4 am and 6 am has not been effective at resolving it, though the pain is less severe.. She desires in-office treatment for resolution, as she is leaving today with her children for the beach.  Previously used topiramate for prevention, but didn't like the side effects. Once Mirena IUD was placed, the headaches essentially resolved. She believes that it is about time for the IUD to be replaced.      Review of Systems HENT: Negative for tinnitus.   Eyes: Negative for photophobia and visual disturbance.  Gastrointestinal: Positive for nausea. Negative for diarrhea and vomiting.  Musculoskeletal: Positive for neck pain.  Neurological: Positive for dizziness (w/ movement) and headaches. Negative for syncope, weakness and numbness.       Patient Active Problem List   Diagnosis Date Noted  . GERD (gastroesophageal reflux disease) 05/30/2015  . Migraine 08/24/2011     Prior to Admission medications   Medication Sig Start Date End Date Taking? Authorizing Provider  levonorgestrel (MIRENA) 20 MCG/24HR IUD 1 each by Intrauterine route once.   Yes [provider]  ondansetron (ZOFRAN-ODT) 8 MG disintegrating tablet Take 1 tablet (8 mg total) by mouth every 8 (eight) hours as needed for nausea. 09/30/17  Yes McVey, Gelene Mink, PA-C  SUMAtriptan (IMITREX) 5 MG/ACT nasal spray Place 1-2 sprays (5-10 mg total) into the nose  every 2 (two) hours as needed for migraine. Max daily dose 40 mg 09/30/17  Yes McVey, Gelene Mink, PA-C     No Known Allergies     Objective:  Physical Exam  Constitutional: She is oriented to person, place, and time. She appears well-developed and well-nourished. She is active and cooperative. No distress.  BP 124/88   Pulse (!) 59   Temp 98.6 F (37 C)   Resp 16   Ht 5\' 6"  (1.676 m)   Wt 156 lb (70.8 kg)   SpO2 98%   BMI 25.18 kg/m   HENT:  Head: Normocephalic and atraumatic.  Right Ear: Hearing and external ear normal.  Left Ear: Hearing and external ear normal.  Nose: Nose normal.  Mouth/Throat: Oropharynx is clear and moist.  Eyes: Pupils are equal, round, and reactive to light. Conjunctivae and EOM are normal. Right eye exhibits no discharge. Left eye exhibits no discharge. No scleral icterus.  Neck: Normal range of motion. Neck supple. No thyromegaly present.  Cardiovascular: Normal rate, regular rhythm and normal heart sounds.  Pulses:      Radial pulses are 2+ on the right side, and 2+ on the left side.  Pulmonary/Chest: Effort normal and breath sounds normal.  Lymphadenopathy:       Head (right side): No tonsillar, no preauricular, no posterior auricular and no occipital adenopathy present.       Head (left side): No tonsillar, no preauricular, no posterior auricular and no occipital adenopathy present.    She has no cervical adenopathy.  Right: No supraclavicular adenopathy present.       Left: No supraclavicular adenopathy present.  Neurological: She is alert and oriented to person, place, and time. She displays normal reflexes. No cranial nerve deficit or sensory deficit. She exhibits normal muscle tone. Coordination normal.  Skin: Skin is warm, dry and intact. No rash noted. No cyanosis or erythema. Nails show no clubbing.  Psychiatric: She has a normal mood and affect. Her speech is normal and behavior is normal.           Assessment & Plan:    Problem List Items Addressed This Visit    Migraine - Primary    Toradol IM here. Ketorolac nasal spray PRN. Suspect two recent migraines are related to decreased effectiveness of levonorgestrel IUD, but stress may also contribute. If she continues to have frequent HA, consider resumption of topiramate until she can get the IUD replaced, or consider other migraine prevention options.      Relevant Medications   ketorolac (TORADOL) injection 60 mg (Completed)   Ketorolac Tromethamine 15.75 MG/SPRAY SOLN       Return if symptoms worsen or fail to improve.   Fara Chute, PA-C [facility] [place]

## 2017-11-18 NOTE — Patient Instructions (Addendum)
1. Hope you feel better. 2. Contact Dr. Edwyna Ready to see when you need to have your IUD removed and replaced.     IF you received an x-ray today, you will receive an invoice from [facility]. Please contact [facility] at [phone] with questions or concerns regarding your invoice.   IF you received labwork today, you will receive an invoice from [facility]. Please contact [facility] at [phone] with questions or concerns regarding your invoice.   Our billing staff will not be able to assist you with questions regarding bills from these companies.  You will be contacted with the lab results as soon as they are available. The fastest way to get your results is to activate your My Chart account. Instructions are located on the last page of this paperwork. If you have not heard from [place] regarding the results in 2 weeks, please contact this office.      Migraine Headache A migraine headache is an intense, throbbing pain on one side or both sides of the head. Migraines may also cause other symptoms, such as nausea, vomiting, and sensitivity to light and noise. What are the causes? Doing or taking certain things may also trigger migraines, such as:  Alcohol.  Smoking.  Medicines, such as: ? Medicine used to treat chest pain (nitroglycerine). ? Birth control pills. ? Estrogen pills. ? Certain blood pressure medicines.  Aged cheeses, chocolate, or caffeine.  Foods or drinks that contain nitrates, glutamate, aspartame, or tyramine.  Physical activity.  Other things that may trigger a migraine include:  Menstruation.  Pregnancy.  Hunger.  Stress, lack of sleep, too much sleep, or fatigue.  Weather changes.  What increases the risk? The following factors may make you more likely to experience migraine headaches:  Age. Risk increases with age.  Family history of migraine headaches.  Being Caucasian.  Depression and anxiety.  Obesity.  Being a  woman.  Having a hole in the heart (patent foramen ovale) or other heart problems.  What are the signs or symptoms? The main symptom of this condition is pulsating or throbbing pain. Pain may:  Happen in any area of the head, such as on one side or both sides.  Interfere with daily activities.  Get worse with physical activity.  Get worse with exposure to bright lights or loud noises.  Other symptoms may include:  Nausea.  Vomiting.  Dizziness.  General sensitivity to bright lights, loud noises, or smells.  Before you get a migraine, you may get warning signs that a migraine is developing (aura). An aura may include:  Seeing flashing lights or having blind spots.  Seeing bright spots, halos, or zigzag lines.  Having tunnel vision or blurred vision.  Having numbness or a tingling feeling.  Having trouble talking.  Having muscle weakness.  How is this diagnosed? A migraine headache can be diagnosed based on:  Your symptoms.  A physical exam.  Tests, such as CT scan or MRI of the head. These imaging tests can help rule out other causes of headaches.  Taking fluid from the spine (lumbar puncture) and analyzing it (cerebrospinal fluid analysis, or CSF analysis).  How is this treated? A migraine headache is usually treated with medicines that:  Relieve pain.  Relieve nausea.  Prevent migraines from coming back.  Treatment may also include:  Acupuncture.  Lifestyle changes like avoiding foods that trigger migraines.  Follow these instructions at home: Medicines  Take over-the-counter and prescription medicines only as told by your health care  provider.  Do not drive or use heavy machinery while taking prescription pain medicine.  To prevent or treat constipation while you are taking prescription pain medicine, your health care provider may recommend that you: ? Drink enough fluid to keep your urine clear or pale yellow. ? Take over-the-counter or  prescription medicines. ? Eat foods that are high in fiber, such as fresh fruits and vegetables, whole grains, and beans. ? Limit foods that are high in fat and processed sugars, such as fried and sweet foods. Lifestyle  Avoid alcohol use.  Do not use any products that contain nicotine or tobacco, such as cigarettes and e-cigarettes. If you need help quitting, ask your health care provider.  Get at least 8 hours of sleep every night.  Limit your stress. General instructions   Keep a journal to find out what may trigger your migraine headaches. For example, write down: ? What you eat and drink. ? How much sleep you get. ? Any change to your diet or medicines.  If you have a migraine: ? Avoid things that make your symptoms worse, such as bright lights. ? It may help to lie down in a dark, quiet room. ? Do not drive or use heavy machinery. ? Ask your health care provider what activities are safe for you while you are experiencing symptoms.  Keep all follow-up visits as told by your health care provider. This is important. Contact a health care provider if:  You develop symptoms that are different or more severe than your usual migraine symptoms. Get help right away if:  Your migraine becomes severe.  You have a fever.  You have a stiff neck.  You have vision loss.  Your muscles feel weak or like you cannot control them.  You start to lose your balance often.  You develop trouble walking.  You faint. This information is not intended to replace advice given to you by your health care provider. Make sure you discuss any questions you have with your health care provider. Document Released: 07/05/2005 Document Revised: 01/23/2016 Document Reviewed: 12/22/2015 Elsevier Interactive Patient Education  2017 [employer].

## 2017-11-18 NOTE — Progress Notes (Signed)
Subjective:    Patient ID: Monica Bennett, female    DOB: [birth year], 47 y.o.   MRN: [id number] Chief Complaint  Patient presents with  . Migraine    started at 3am     HPI  47 yo female presents for evaluation of migraine that started at 3am today.  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 She is on levonorgestrel IUD for this, unsure of placement date. Placed by Dr. Edwyna Ready at [facility] gyn. Migraine is less severe than previous, she caught it early and had used her imitrex. Imitrex  nasal spray at 4am and 6am, helped from progressing but has not made it better. Zofran at 8am has helped relieve nausea and prevent vomiting. Pain located bilateral temporals and down both sides of her neck, same as always.  It is not progressing, but not improving. 6/10 pain.   Denies vomiting, photophobia, weakness, numbness.  Review of Systems  HENT: Negative for tinnitus.   Eyes: Negative for photophobia and visual disturbance.  Gastrointestinal: Positive for nausea. Negative for diarrhea and vomiting.  Musculoskeletal: Positive for neck pain.  Neurological: Positive for dizziness (w/ movement) and headaches. Negative for syncope, weakness and numbness.      Patient Active Problem List   Diagnosis Date Noted  . GERD (gastroesophageal reflux disease) 05/30/2015  . Migraine 08/24/2011    Past Medical History:  Diagnosis Date  . GERD (gastroesophageal reflux disease)   . Migraines     Prior to Admission medications   Medication Sig Start Date End Date Taking? Authorizing Provider  levonorgestrel (MIRENA) 20 MCG/24HR IUD 1 each by Intrauterine route once.   Yes [provider]  ondansetron (ZOFRAN-ODT) 8 MG disintegrating tablet Take 1 tablet (8 mg total) by mouth every 8 (eight) hours as needed for nausea. 09/30/17  Yes McVey, Gelene Mink, PA-C  SUMAtriptan (IMITREX) 5 MG/ACT nasal spray Place 1-2 sprays (5-10 mg total) into the nose  every 2 (two) hours as needed for migraine. Max daily dose 40 mg 09/30/17  Yes McVey, Gelene Mink, PA-C    No Known Allergies     Objective:   Physical Exam  Constitutional: She is oriented to person, place, and time. She appears well-developed and well-nourished. No distress.  BP 124/88   Pulse (!) 59   Temp 98.6 F (37 C)   Resp 16   Ht 5\' 6"  (1.676 m)   Wt 156 lb (70.8 kg)   SpO2 98%   BMI 25.18 kg/m    HENT:  Head: Normocephalic and atraumatic.  Eyes: Pupils are equal, round, and reactive to light. Conjunctivae and EOM are normal. Right eye exhibits no discharge. Left eye exhibits no discharge. No scleral icterus.  Neck: Normal range of motion. Neck supple. No tracheal deviation present. No thyromegaly present.  Cardiovascular: Normal rate, regular rhythm, normal heart sounds and intact distal pulses.  Pulmonary/Chest: Effort normal and breath sounds normal. No stridor. No respiratory distress. She has no wheezes. She has no rales. She exhibits no tenderness.  Lymphadenopathy:    She has no cervical adenopathy.  Neurological: She is alert and oriented to person, place, and time. She has normal strength and normal reflexes. No cranial nerve deficit or sensory deficit. She exhibits normal muscle tone. Coordination normal.  Skin: Skin is warm and dry. No rash noted. She is not diaphoretic. No erythema. No pallor.  Psychiatric: She has a normal mood and affect. Her behavior is normal. Judgment and thought content  normal.       Assessment & Plan:  1. Intractable migraine without aura and with status migrainosus Previously ketorolac 60mg  IM has provided significant relief. Try again, this migraine was normal for her migraines and denies any new symptoms or characteristics. Provided ketorolac nasal spray to help with treatment. Encouraged to go see gynecologist to have her mirena changed as it has been 5-6 years per pt.  - ketorolac (TORADOL) injection 60 mg - Ketorolac  Tromethamine 15.75 MG/SPRAY SOLN; Place 15.75 mg into the nose 4 (four) times daily as needed (migraine).  Dispense: 1 each; Refill: 5

## 2017-11-20 ENCOUNTER — Encounter: Payer: Self-pay | Admitting: Physician Assistant

## 2017-11-20 NOTE — Assessment & Plan Note (Signed)
Toradol IM here. Ketorolac nasal spray PRN. Suspect two recent migraines are related to decreased effectiveness of levonorgestrel IUD, but stress may also contribute. If she continues to have frequent HA, consider resumption of topiramate until she can get the IUD replaced, or consider other migraine prevention options.

## 2017-11-21 ENCOUNTER — Telehealth: Payer: Self-pay

## 2017-11-21 NOTE — Telephone Encounter (Signed)
Prior auth for sprix denied by [employer]. Denial placed in your box with recommendations for alternatives that must be tried first.

## 2017-11-21 NOTE — Telephone Encounter (Signed)
Prior Auth started for Sprix spray. Likely to deny due to no documentation stating pt has tried and failed voltaren, ibuprofen, and/or meloxicam.

## 2018-01-04 DIAGNOSIS — Z6825 Body mass index (BMI) 25.0-25.9, adult: Secondary | ICD-10-CM | POA: Diagnosis not present

## 2018-01-04 DIAGNOSIS — Z1231 Encounter for screening mammogram for malignant neoplasm of breast: Secondary | ICD-10-CM | POA: Diagnosis not present

## 2018-01-04 DIAGNOSIS — Z30433 Encounter for removal and reinsertion of intrauterine contraceptive device: Secondary | ICD-10-CM | POA: Diagnosis not present

## 2018-01-04 DIAGNOSIS — Z01419 Encounter for gynecological examination (general) (routine) without abnormal findings: Secondary | ICD-10-CM | POA: Diagnosis not present

## 2018-04-20 DIAGNOSIS — H11152 Pinguecula, left eye: Secondary | ICD-10-CM | POA: Diagnosis not present

## 2018-04-20 DIAGNOSIS — H11001 Unspecified pterygium of right eye: Secondary | ICD-10-CM | POA: Diagnosis not present

## 2018-06-28 DIAGNOSIS — Z23 Encounter for immunization: Secondary | ICD-10-CM | POA: Diagnosis not present

## 2018-12-07 DIAGNOSIS — Z03818 Encounter for observation for suspected exposure to other biological agents ruled out: Secondary | ICD-10-CM | POA: Diagnosis not present

## 2019-08-13 DIAGNOSIS — M67911 Unspecified disorder of synovium and tendon, right shoulder: Secondary | ICD-10-CM | POA: Diagnosis not present

## 2019-09-17 DIAGNOSIS — M67911 Unspecified disorder of synovium and tendon, right shoulder: Secondary | ICD-10-CM | POA: Diagnosis not present

## 2019-11-06 DIAGNOSIS — N939 Abnormal uterine and vaginal bleeding, unspecified: Secondary | ICD-10-CM | POA: Diagnosis not present

## 2020-02-11 DIAGNOSIS — K219 Gastro-esophageal reflux disease without esophagitis: Secondary | ICD-10-CM | POA: Diagnosis not present

## 2020-02-11 DIAGNOSIS — R05 Cough: Secondary | ICD-10-CM | POA: Diagnosis not present

## 2020-03-05 DIAGNOSIS — R079 Chest pain, unspecified: Secondary | ICD-10-CM | POA: Diagnosis not present

## 2020-03-05 DIAGNOSIS — R0982 Postnasal drip: Secondary | ICD-10-CM | POA: Diagnosis not present

## 2020-03-05 DIAGNOSIS — R0602 Shortness of breath: Secondary | ICD-10-CM | POA: Diagnosis not present

## 2020-03-05 DIAGNOSIS — R05 Cough: Secondary | ICD-10-CM | POA: Diagnosis not present

## 2020-05-01 DIAGNOSIS — R059 Cough, unspecified: Secondary | ICD-10-CM | POA: Diagnosis not present

## 2020-05-27 ENCOUNTER — Other Ambulatory Visit: Payer: Self-pay

## 2020-05-27 ENCOUNTER — Encounter: Payer: Self-pay | Admitting: Internal Medicine

## 2020-05-27 ENCOUNTER — Ambulatory Visit (INDEPENDENT_AMBULATORY_CARE_PROVIDER_SITE_OTHER): Payer: BC Managed Care – PPO | Admitting: Internal Medicine

## 2020-05-27 VITALS — BP 118/80 | HR 60 | Temp 97.0°F | Ht 66.0 in | Wt 173.0 lb

## 2020-05-27 DIAGNOSIS — Z23 Encounter for immunization: Secondary | ICD-10-CM | POA: Diagnosis not present

## 2020-05-27 DIAGNOSIS — R058 Other specified cough: Secondary | ICD-10-CM | POA: Insufficient documentation

## 2020-05-27 LAB — CBC WITH DIFFERENTIAL/PLATELET
Basophils Absolute: 0.1 10*3/uL (ref 0.0–0.1)
Basophils Relative: 1.1 % (ref 0.0–3.0)
Eosinophils Absolute: 0.2 10*3/uL (ref 0.0–0.7)
Eosinophils Relative: 2.9 % (ref 0.0–5.0)
HCT: 42.1 % (ref 36.0–46.0)
Hemoglobin: 14.7 g/dL (ref 12.0–15.0)
Lymphocytes Relative: 27.2 % (ref 12.0–46.0)
Lymphs Abs: 1.7 10*3/uL (ref 0.7–4.0)
MCHC: 34.9 g/dL (ref 30.0–36.0)
MCV: 92.6 fl (ref 78.0–100.0)
Monocytes Absolute: 0.4 10*3/uL (ref 0.1–1.0)
Monocytes Relative: 6.9 % (ref 3.0–12.0)
Neutro Abs: 3.8 10*3/uL (ref 1.4–7.7)
Neutrophils Relative %: 61.9 % (ref 43.0–77.0)
Platelets: 292 10*3/uL (ref 150.0–400.0)
RBC: 4.54 Mil/uL (ref 3.87–5.11)
RDW: 13.1 % (ref 11.5–15.5)
WBC: 6.2 10*3/uL (ref 4.0–10.5)

## 2020-05-27 MED ORDER — PREDNISONE 10 MG PO TABS: ORAL_TABLET | ORAL | 0 refills | Status: DC

## 2020-05-27 MED ORDER — FAMOTIDINE 20 MG PO TABS: ORAL_TABLET | ORAL | 11 refills | Status: DC

## 2020-05-27 MED ORDER — GABAPENTIN 100 MG PO CAPS: 100.0000 mg | ORAL_CAPSULE | Freq: Three times a day (TID) | ORAL | 2 refills | Status: DC

## 2020-05-27 MED ORDER — PANTOPRAZOLE SODIUM 40 MG PO TBEC: 40.0000 mg | DELAYED_RELEASE_TABLET | Freq: Every day | ORAL | 2 refills | Status: DC

## 2020-05-27 NOTE — Progress Notes (Signed)
Monica Bennett, female    DOB: 12-20-1970     MRN: [id number]   Brief patient profile:  19 yowf never smoker never any problems other than with cough p exp to strong perfume (dad same problem)  until around 2007 which was 2 years p twins born- IUP  was notable for significant reflux and improved but never resolved assoc with sense of throat tickle despite allergy evaluation at [facility] neg >>>    EGD [place] "confirmed GERD" but no improvement on acid suppression/ gabapentin 100 tid then ENT eval  Nl exam 05/01/20 > recommend avoid caffeine > no better so referred to pulmonary clinic 05/27/2020 by Dr   Ashby Dawes.    "Kicked out of cough study" in [place] s taking the study drug   History of Present Illness  05/27/2020  Pulmonary/ 1st office eval/Mitchael Luckey on IUD with [facility] around year 6 of rx ? On bcp's prior - "can't remember" Chief Complaint  Patient presents with  . Pulmonary Consult    Referred by Dr Merrilee Seashore. Pt c/o cough x 10 years, worse since May 2021. She states when she exercises she gets coughing spells that won't stop. Her cough is non prod.   Dyspnea:  Feels heaviness with exercise since Spring 2021 also assoc with menorrhagia  Cough: Dry  Mostly daytime worse when speaks / better when lies down and sleeps / worse with mint toothpaste / perfume Sleep: fine flat  SABA use: inhalers Urinary incont assoc, gags but never vomits  No obvious day to day or daytime variability or assoc excess/ purulent sputum or mucus plugs or hemoptysis or cp or chest tightness, subjective wheeze or overt sinus or hb symptoms.   Sleeping  without nocturnal  or early am exacerbation  of respiratory  c/o's or need for noct saba. Also denies any obvious fluctuation of symptoms with weather or environmental changes or other aggravating or alleviating factors except as outlined above.  No unusual exposure hx or h/o childhood pna/ asthma or knowledge of premature birth.  Current Allergies, Complete Past  Medical History, Past Surgical History, Family History, and Social History were reviewed in [employer] record.  ROS  The following are not active complaints unless bolded Hoarseness, sore throat, dysphagia, dental problems, itching, sneezing,  nasal congestion or discharge of excess mucus or purulent secretions, ear ache,   fever, chills, sweats, unintended wt loss or wt gain, classically pleuritic or exertional cp,  orthopnea pnd or arm/hand swelling  or leg swelling, presyncope, palpitations, abdominal pain, anorexia, nausea, vomiting, diarrhea  or change in bowel habits or change in bladder habits, change in stools or change in urine, dysuria, hematuria,  rash, arthralgias, visual complaints, headache, numbness, weakness or ataxia or problems with walking or coordination,  change in mood or  memory.             Past Medical History:  Diagnosis Date  . GERD (gastroesophageal reflux disease)   . Migraines     Outpatient Medications Prior to Visit  Medication Sig Dispense Refill  . levonorgestrel (MIRENA) 20 MCG/24HR IUD 1 each by Intrauterine route once.    . ondansetron (ZOFRAN-ODT) 8 MG disintegrating tablet Take 1 tablet (8 mg total) by mouth every 8 (eight) hours as needed for nausea. 12 tablet 0  . SUMAtriptan (IMITREX) 5 MG/ACT nasal spray Place 1-2 sprays (5-10 mg total) into the nose every 2 (two) hours as needed for migraine. Max daily dose 40 mg 1 Inhaler 0  .  Ketorolac Tromethamine 15.75 MG/SPRAY SOLN Place 15.75 mg into the nose 4 (four) times daily as needed (migraine). 1 each 5   No facility-administered medications prior to visit.     Objective:     BP 118/80 (BP Location: Left Arm, Cuff Size: Normal)   Pulse 60   Temp (!) 97 F (36.1 C) (Temporal)   Ht 5\' 6"  (1.676 m)   Wt 173 lb (78.5 kg)   SpO2 98% Comment: on RA  BMI 27.92 kg/m   SpO2: 98 % (on RA)   amb wf nad    HEENT : pt wearing mask not removed for exam due to covid -19  concerns.    NECK :  without JVD/Nodes/TM/ nl carotid upstrokes bilaterally   LUNGS: no acc muscle use,  Nl contour chest which is clear to A and P bilaterally without reproducible  cough on insp or exp maneuvers (most of the time though it was insp and than exp)    CV:  RRR  no s3 or murmur or increase in P2, and no edema   ABD:  soft and nontender with nl inspiratory excursion in the supine position. No bruits or organomegaly appreciated, bowel sounds nl  MS:  Nl gait/ ext warm without deformities, calf tenderness, cyanosis or clubbing No obvious joint restrictions   SKIN: warm and dry without lesions    NEURO:  alert, approp, nl sensorium with  no motor or cerebellar deficits apparent.     cxr per pt per PCP ok w/in last sev months   Labs ordered 05/27/2020  :  allergy profile           Assessment   No problem-specific Assessment & Plan notes found for this encounter.     Christinia Gully, MD 05/27/2020

## 2020-05-27 NOTE — Assessment & Plan Note (Addendum)
Onset:  All her life when exp to perfumes/ dad had same problem - much worse p IUP x twins in 2005 on  [place] since at least 2005 with eval by allergy, ent, gi dx gerd but not better on acid suppression or low dose gabapentin - Allergy profile 05/27/2020 >  Eos 0. /  IgE  Pending  - cyclical cough rx 40/09/4740    Upper airway cough syndrome (previously labeled PNDS),  is so named because it's frequently impossible to sort out how much is  CR/sinusitis with freq throat clearing (which can be related to primary GERD)   vs  causing  secondary (" extra esophageal")  GERD from wide swings in gastric pressure that occur with throat clearing, often  promoting self use of mint and menthol lozenges that reduce the lower esophageal sphincter tone and exacerbate the problem further in a cyclical fashion.   These are the same pts (now being labeled as having "irritable larynx syndrome" by some cough centers) who not infrequently have a history of having failed to tolerate ace inhibitors,  dry powder inhalers or biphosphonates or report having atypical/extraesophageal reflux symptoms that don't respond to standard doses of PPI  and are easily confused as having aecopd or asthma flares by even experienced allergists/ pulmonologists (myself included).  In addition:  Of the three most common causes of  Sub-acute / recurrent or chronic cough, only one (GERD)  can actually contribute to/ trigger  the other two (asthma and post nasal drip syndrome)  and perpetuate the cylce of cough.  While not intuitively obvious, many patients with chronic low grade reflux do not cough until there is a primary insult that disturbs the protective epithelial barrier and exposes sensitive nerve endings.   This is typically viral but can due to PNDS and  either may apply here.      >>>  The point is that once this occurs, it is difficult to eliminate the cycle  using anything but a maximally effective acid suppression regimen at least in  the short run, accompanied by an appropriate diet to address non acid GERD and control / eliminate pnds with 1st gen H1 blockers per guidelines  And  the cough itself with gabapentin titrated as high as 300 mg tid and Also added 6 days of Prednisone in case of component of Th-2 driven upper or lower airways inflammation (if cough responds short term only to relapse befor return while will on rx for uacs that would point to allergic rhinitis/ asthma or eos bronchitis) .   Advised: The standardized cough guidelines published in Chest by Lissa Morales in 2006 are still the best available and consist of a multiple step process (up to 12!) , not a single office visit,  and are intended  to address this problem logically,  with an alogrithm dependent on response to empiric treatment at  each progressive step  to determine a specific diagnosis with  minimal addtional testing needed. Therefore if adherence is an issue or can't be accurately verified,  it's very unlikely the standard evaluation and treatment will be successful here.    Furthermore, response to therapy (other than acute cough suppression, which should only be used short term with avoidance of narcotic containing cough syrups if possible), can be a gradual process for which the patient is not likely to  perceive immediate benefit.  Unlike going to an eye doctor where the best perscription is almost always the first one and is immediately effective, this  is almost never the case in the management of chronic cough syndromes. Therefore the patient needs to commit up front to consistently adhere to recommendations  for up to 6 weeks of therapy directed at the likely underlying problem(s) before the response can be reasonably evaluated.    >>> f/u in 2 weeks to recheck progress           Each maintenance medication was reviewed in detail including emphasizing most importantly the difference between maintenance and prns and under what circumstances  the prns are to be triggered using an action plan format where appropriate.  Total time for H and P, chart review, counseling, teaching device and generating customized AVS unique to this office visit / charting = 50 min

## 2020-05-27 NOTE — Patient Instructions (Signed)
The key to effective treatment for your cough is eliminating the non-stop cycle of cough you're stuck in long enough to let your airway heal completely and then see if there is anything still making you cough once you stop the cough suppression, but this should take no more than 5 days to figure out  Gabapentin 100  Mg  Three times daily gradually increase to 300 mg three times days   Prednisone 10 mg take  4 each am x 2 days,   2 each am x 2 days,  1 each am x 2 days and stop (this is to eliminate allergies and inflammation from coughing)  Protonix (pantoprazole) Take 30-60 min before first meal of the day and Pepcid 20 mg one bedtime plus chlorpheniramine 4 mg x 2 at bedtime (both available over the counter)  until cough is completely gone for at least a week without the need for cough suppression  For drainage / throat tickle try take CHLORPHENIRAMINE  4 mg  (Chlortab 4mg   at [employer] should be easiest to find in the green box)  take one every 4 hours as needed - available over the counter- may cause drowsiness so start with just a bedtime dose or two and see how you tolerate it before trying in daytime    GERD (REFLUX)  is an extremely common cause of respiratory symptoms, many times with no significant heartburn at all.    It can be treated with medication, but also with lifestyle changes including avoidance of late meals, excessive alcohol, smoking cessation, and avoid fatty foods, chocolate, peppermint, colas, red wine, and acidic juices such as orange juice.  NO MINT OR MENTHOL PRODUCTS SO NO COUGH DROPS   USE HARD CANDY INSTEAD (jolley ranchers or Stover's or Lifesavers (all available in sugarless versions) NO OIL BASED VITAMINS - use powdered substitutes.   Please remember to go to the lab department   for your tests - we will call you with the results when they are available.      Please schedule a follow up office visit in 2 weeks, sooner if needed

## 2020-05-28 LAB — IGE: IgE (Immunoglobulin E), Serum: 20 kU/L (ref <=114)

## 2020-05-29 NOTE — Progress Notes (Signed)
Spoke with pt and notified of results per Dr. Wert. Pt verbalized understanding and denied any questions. 

## 2020-06-11 ENCOUNTER — Encounter: Payer: Self-pay | Admitting: Pulmonary Disease

## 2020-06-11 ENCOUNTER — Other Ambulatory Visit: Payer: Self-pay

## 2020-06-11 ENCOUNTER — Ambulatory Visit (INDEPENDENT_AMBULATORY_CARE_PROVIDER_SITE_OTHER): Payer: BC Managed Care – PPO | Admitting: Pulmonary Disease

## 2020-06-11 VITALS — BP 112/72 | HR 79 | Temp 98.7°F | Ht 66.0 in | Wt 174.2 lb

## 2020-06-11 DIAGNOSIS — Z0000 Encounter for general adult medical examination without abnormal findings: Secondary | ICD-10-CM

## 2020-06-11 DIAGNOSIS — R058 Other specified cough: Secondary | ICD-10-CM

## 2020-06-11 DIAGNOSIS — K219 Gastro-esophageal reflux disease without esophagitis: Secondary | ICD-10-CM

## 2020-06-11 NOTE — Assessment & Plan Note (Signed)
Plan: Continue Protonix Continue Pepcid

## 2020-06-11 NOTE — Progress Notes (Signed)
@Patient  ID: Monica Bennett, female    DOB: 1970/08/02, 49 y.o.   MRN: [id number]  Chief Complaint  Patient presents with  . Follow-up    Cough, doing better    Referring provider: No ref. provider found  HPI:  49 year old female never smoker followed in our office for chronic cough  PMH: Migraine, GERD Smoker/ Smoking History: Never smoker Maintenance: None Pt of: Dr. Melvyn Novas  06/11/2020  - Visit   49 year old female never smoker initially referred to our office on 05/27/2020 from primary care for chronic cough.  Patient is reporting cough for 10 years.  Worsened since May/2021.  Patient established care with Dr. Melvyn Novas.   Patient presented to her office today as a 2-week follow-up.  She reports that her cough has improved significantly.  She feels the thing that has helped the cough the most was prednisone.  She reports that she barely coughed at all when she took prednisone.  Her IgE lab test was normal.  Her CBC with differential did show mild eosinophilia at 200.  She is also using 1.5 chlor tabs at night.  She felt that 2 tablets made her too drowsy.  She is not taking a morning antihistamine.  She has no breakthrough GERD reflux symptoms.  She is also taking gabapentin 100 mg 3 times daily.  She feels that this has been helpful.  Patient has had her first COVID-19 vaccinations.  She is planning on receiving her booster vaccination she just has not scheduled this yet.  She has had her seasonal flu vaccine.  We have no previous chest x-ray on file.  She reports that her old primary care provider who referred her to our office did a chest x-ray as she feels may have shown bronchitis.  We do not have these images.  We will try to request them today.  Questionaires / Pulmonary Flowsheets:   ACT:  No flowsheet data found.  MMRC: No flowsheet data found.  Epworth:  No flowsheet data found.  Tests:   05/27/2020-CBC with differential-eosinophils relative 2.9, eosinophils absolute  0.2 05/27/2020-IgE-20  FENO:  No results found for: NITRICOXIDE  PFT: No flowsheet data found.  WALK:  No flowsheet data found.  Imaging: No results found.  Lab Results:  CBC    Component Value Date/Time   WBC 6.2 05/27/2020 1005   RBC 4.54 05/27/2020 1005   HGB 14.7 05/27/2020 1005   HCT 42.1 05/27/2020 1005   PLT 292.0 05/27/2020 1005   MCV 92.6 05/27/2020 1005   MCHC 34.9 05/27/2020 1005   RDW 13.1 05/27/2020 1005   LYMPHSABS 1.7 05/27/2020 1005   MONOABS 0.4 05/27/2020 1005   EOSABS 0.2 05/27/2020 1005   BASOSABS 0.1 05/27/2020 1005    BMET No results found for: NA, K, CL, CO2, GLUCOSE, BUN, CREATININE, CALCIUM, GFRNONAA, GFRAA  BNP No results found for: BNP  ProBNP No results found for: PROBNP  Specialty Problems      Pulmonary Problems   Upper airway cough syndrome    Onset:  All her life when exp to perfumes/ dad had same problem - much worse p IUP x twins in 2005 on  [place] since at least 2005 with eval by allergy, ent, gi dx gerd but not better on acid suppression or low dose gabapentin - Allergy profile 05/27/2020 >  Eos 0.2 /  IgE  20 - cyclical cough rx 73/08/2023  06/11/2020-follow-up in office, cough had significantly improved, felt to be prednisone responsive, also improved with gabapentin 100  mg 3 times daily          No Known Allergies  Immunization History  Administered Date(s) Administered  . Influenza Inj Mdck Quad Pf 05/28/2017  . Influenza Split 07/17/2014  . Influenza,inj,Quad PF,6+ Mos 05/27/2020  . Influenza,inj,quad, With Preservative 05/19/2016  . Influenza-Unspecified 05/19/2017  . Moderna SARS-COVID-2 Vaccination 10/06/2019, 11/06/2019    Past Medical History:  Diagnosis Date  . GERD (gastroesophageal reflux disease)   . Migraines     Tobacco History: Social History   Tobacco Use  Smoking Status Never Smoker  Smokeless Tobacco Never Used   Counseling given: Not Answered   Continue to not  smoke  Outpatient Encounter Medications as of 06/11/2020  Medication Sig  . famotidine (PEPCID) 20 MG tablet One after supper  . gabapentin (NEURONTIN) 100 MG capsule Take 1 capsule (100 mg total) by mouth 3 (three) times daily. One three times daily  . levonorgestrel (MIRENA) 20 MCG/24HR IUD 1 each by Intrauterine route once.  . ondansetron (ZOFRAN-ODT) 8 MG disintegrating tablet Take 1 tablet (8 mg total) by mouth every 8 (eight) hours as needed for nausea.  . pantoprazole (PROTONIX) 40 MG tablet Take 1 tablet (40 mg total) by mouth daily. Take 30-60 min before first meal of the day  . SUMAtriptan (IMITREX) 5 MG/ACT nasal spray Place 1-2 sprays (5-10 mg total) into the nose every 2 (two) hours as needed for migraine. Max daily dose 40 mg  . [DISCONTINUED] predniSONE (DELTASONE) 10 MG tablet Take  4 each am x 2 days,   2 each am x 2 days,  1 each am x 2 days and stop   No facility-administered encounter medications on file as of 06/11/2020.     Review of Systems  Review of Systems  Constitutional: Negative for activity change, fatigue and fever.  HENT: Negative for sinus pressure, sinus pain and sore throat.   Respiratory: Positive for cough (improved ). Negative for shortness of breath and wheezing.   Cardiovascular: Negative for chest pain and palpitations.  Gastrointestinal: Negative for diarrhea, nausea and vomiting.  Musculoskeletal: Negative for arthralgias.  Neurological: Negative for dizziness.  Psychiatric/Behavioral: Negative for sleep disturbance. The patient is not nervous/anxious.      Physical Exam  BP 112/72 (BP Location: Left Arm, Cuff Size: Normal)   Pulse 79   Temp 98.7 F (37.1 C) (Oral)   Ht 5\' 6"  (1.676 m)   Wt 174 lb 3.2 oz (79 kg)   SpO2 97%   BMI 28.12 kg/m   Wt Readings from Last 5 Encounters:  06/11/20 174 lb 3.2 oz (79 kg)  05/27/20 173 lb (78.5 kg)  11/18/17 156 lb (70.8 kg)  09/30/17 147 lb (66.7 kg)  05/30/15 171 lb (77.6 kg)    BMI  Readings from Last 5 Encounters:  06/11/20 28.12 kg/m  05/27/20 27.92 kg/m  11/18/17 25.18 kg/m  09/30/17 23.73 kg/m  05/30/15 27.60 kg/m     Physical Exam Vitals and nursing note reviewed.  Constitutional:      General: She is not in acute distress.    Appearance: Normal appearance. She is normal weight.  HENT:     Head: Normocephalic and atraumatic.     Right Ear: External ear normal.     Left Ear: External ear normal.     Nose: Rhinorrhea present. No congestion.     Mouth/Throat:     Mouth: Mucous membranes are moist.     Pharynx: Oropharynx is clear.     Comments: Postnasal  drip Eyes:     Pupils: Pupils are equal, round, and reactive to light.  Cardiovascular:     Rate and Rhythm: Normal rate and regular rhythm.     Pulses: Normal pulses.     Heart sounds: Normal heart sounds. No murmur heard.   Pulmonary:     Effort: Pulmonary effort is normal. No respiratory distress.     Breath sounds: Normal breath sounds. No decreased air movement. No decreased breath sounds, wheezing or rales.  Musculoskeletal:     Cervical back: Normal range of motion.  Skin:    General: Skin is warm and dry.     Capillary Refill: Capillary refill takes less than 2 seconds.  Neurological:     General: No focal deficit present.     Mental Status: She is alert and oriented to person, place, and time. Mental status is at baseline.     Gait: Gait normal.  Psychiatric:        Mood and Affect: Mood normal.        Behavior: Behavior normal.        Thought Content: Thought content normal.        Judgment: Judgment normal.       Assessment & Plan:   Upper airway cough syndrome 2-week follow-up Cough has improved Patient started gabapentin 100 mg 3 times daily based off cough neuropathy guidelines in chest >>>Based off of January/2016 CHEST guideline and Expert Panel Report Patient feels like cough was prednisone responsive Rhinorrhea and postnasal drip on exam  today  Plan: Continue gabapentin 100 mg 3 times daily Continue Protonix 40 mg Continue Pepcid 20 mg Continue chlor tabs at night Start morning daily antihistamine Can also do nasal saline rinses Can also start fluticasone/Flonase nasal spray 1 spray each nostril as needed for nasal congestion or allergy-like symptoms We will request records of last chest x-ray with primary care  If cough flares may need to consider pulmonary function testing, repeat chest imaging, increasing gabapentin    GERD (gastroesophageal reflux disease) Plan: Continue Protonix Continue Pepcid  Healthcare maintenance Plan:  Obtain COVID-19 booster    Return in about 4 months (around 10/09/2020), or if symptoms worsen or fail to improve, for Follow up with Dr. Melvyn Novas.   Lauraine Rinne, NP 06/11/2020   This appointment required 32 minutes of patient care (this includes precharting, chart review, review of results, face-to-face care, etc.).

## 2020-06-11 NOTE — Assessment & Plan Note (Signed)
2-week follow-up Cough has improved Patient started gabapentin 100 mg 3 times daily based off cough neuropathy guidelines in chest >>>Based off of January/2016 CHEST guideline and Expert Panel Report Patient feels like cough was prednisone responsive Rhinorrhea and postnasal drip on exam today  Plan: Continue gabapentin 100 mg 3 times daily Continue Protonix 40 mg Continue Pepcid 20 mg Continue chlor tabs at night Start morning daily antihistamine Can also do nasal saline rinses Can also start fluticasone/Flonase nasal spray 1 spray each nostril as needed for nasal congestion or allergy-like symptoms We will request records of last chest x-ray with primary care  If cough flares may need to consider pulmonary function testing, repeat chest imaging, increasing gabapentin

## 2020-06-11 NOTE — Patient Instructions (Addendum)
You were seen today by Lauraine Rinne, NP  for:   1. Upper airway cough syndrome  We will request records from your primary care - Dr. Ashby Dawes for your chest xray results   Please start taking a daily antihistamine:  >>>choose one of: zyrtec, claritin, allegra, or xyzal  >>>these are over the counter medications  >>>can choose generic option  >>>take daily  >>>this medication helps with allergies, post nasal drip, and cough   Can consider starting fluticasone/Flonase intranasal spray 1 spray each nostril as needed for nasal congestion or allergic rhinitis symptoms  Can start nasal saline rinses twice daily Use distilled water Shake well Get bottle lukewarm like a baby bottle  Continue taking chlorpheniramine (aka Chlor tabs) 4 mg tablet (1 to 2 tablets at night) for management of allergies and postnasal drip at night >>> This is an over-the-counter medication >>> This medication is sedating  Continue to taking gabapentin as prescribed  Continue Protonix 40 mg  Continue Pepcid 20 mg  2. Gastroesophageal reflux disease without esophagitis  Protonix 40 mg tablet  >>>Please take 1 tablet daily 15 minutes to 30 minutes before your first meal of the day as well as before your other medications >>>Try to take at the same time each day >>>take this medication daily  Continue Pepcid 20 mg in the evening  GERD management: >>>Avoid laying flat until 2 hours after meals >>>Elevate head of the bed including entire chest >>>Reduce size of meals and amount of fat, acid, spices, caffeine and sweets >>>If you are smoking, Please stop! >>>Decrease alcohol consumption >>>Work on maintaining a healthy weight with normal BMI    3. Healthcare maintenance  Obtain COVID-19 booster  Okay to increase exercise  Follow Up:    Return in about 4 months (around 10/09/2020), or if symptoms worsen or fail to improve, for Follow up with Dr. Melvyn Novas.   Notification of test results are managed  in the following manner: If there are  any recommendations or changes to the  plan of care discussed in office today,  we will contact you and let you know what they are. If you do not hear from [place], then your results are normal and you can view them through your  MyChart account , or a letter will be sent to you. Thank you again for trusting [place] with your care  - Thank you, [facility]    It is flu season:   >>> Best ways to protect herself from the flu: Receive the yearly flu vaccine, practice good hand hygiene washing with soap and also using hand sanitizer when available, eat a nutritious meals, get adequate rest, hydrate appropriately       Please contact the office if your symptoms worsen or you have concerns that you are not improving.   Thank you for choosing [facility] for your healthcare, and for allowing [place] to partner with you on your healthcare journey. I am thankful to be able to provide care to you today.   Wyn Quaker FNP-C

## 2020-06-11 NOTE — Assessment & Plan Note (Signed)
Plan:  Obtain COVID-19 booster

## 2020-08-07 DIAGNOSIS — U071 COVID-19: Secondary | ICD-10-CM

## 2020-08-07 HISTORY — DX: COVID-19: U07.1

## 2020-08-14 DIAGNOSIS — N921 Excessive and frequent menstruation with irregular cycle: Secondary | ICD-10-CM | POA: Diagnosis not present

## 2020-08-20 ENCOUNTER — Other Ambulatory Visit: Payer: Self-pay | Admitting: Internal Medicine

## 2020-08-20 DIAGNOSIS — R058 Other specified cough: Secondary | ICD-10-CM

## 2020-08-25 ENCOUNTER — Telehealth: Payer: Self-pay | Admitting: Internal Medicine

## 2020-08-25 MED ORDER — PREDNISONE 10 MG PO TABS: ORAL_TABLET | ORAL | 0 refills | Status: DC

## 2020-08-25 NOTE — Telephone Encounter (Signed)
pt is calling because she had started doing a medication regimen with Dr. Melvyn Novas back in 05/2020 pt states she was doing great till her kids tested positive for COVID on 1/14.Marland Kitchen Which is around the time the pt started coughing again, pt states she has everything that Dr Melvyn Novas prescribed for her but the Steroids. Was wondering if Dr. Melvyn Novas would  write scription for the steroids or If he has other advise  please advise [phone]

## 2020-08-25 NOTE — Telephone Encounter (Signed)
Called and spoke with patient who states that she thought her other meds would automatically be refilled and when she went to the pharmacy they were not. Advised patient that she has to call the pharmacy and let them know that she needs a refill on the other medications. She expressed understanding. Nothing further needed at this time.

## 2020-08-25 NOTE — Telephone Encounter (Signed)
Called and spoke to patient, who stated that her cough subsided with regimen that Dr. Melvyn Novas gave her during last visit.  On 08/01/2020, her children tested positive for covid. Her cough returned around that time and has lingered since. Cough is dry.  Denied additional symptoms.  She did not test for covid due to testing availability. She is fully vaccinated against covid and flu.  She is using pepcid after supper, gabapentin TID and Protonix before first meal of the day.  She feels that she would benefit from a course of prednisone.   Dr. Melvyn Novas, please advise. Thanks  Current Outpatient Medications on File Prior to Visit  Medication Sig Dispense Refill  . famotidine (PEPCID) 20 MG tablet One after supper 30 tablet 11  . gabapentin (NEURONTIN) 100 MG capsule Take 1 capsule (100 mg total) by mouth 3 (three) times daily. One three times daily 120 capsule 2  . levonorgestrel (MIRENA) 20 MCG/24HR IUD 1 each by Intrauterine route once.    . ondansetron (ZOFRAN-ODT) 8 MG disintegrating tablet Take 1 tablet (8 mg total) by mouth every 8 (eight) hours as needed for nausea. 12 tablet 0  . pantoprazole (PROTONIX) 40 MG tablet TAKE 1 TABLET (40 MG TOTAL) BY MOUTH DAILY. TAKE 30-60 MIN BEFORE FIRST MEAL OF THE DAY 90 tablet 3  . SUMAtriptan (IMITREX) 5 MG/ACT nasal spray Place 1-2 sprays (5-10 mg total) into the nose every 2 (two) hours as needed for migraine. Max daily dose 40 mg 1 Inhaler 0   No current facility-administered medications on file prior to visit.    No Known Allergies

## 2020-08-25 NOTE — Telephone Encounter (Signed)
Patient is aware of recommendations and voiced her understanding.  Rx for prednisone has been sent to preferred pharmacy. She will call back in 1 week for ov with symptoms do not improve.  Nothing further needed at this time.

## 2020-08-25 NOTE — Telephone Encounter (Signed)
Prednisone 10 mg take  4 each am x 2 days,   2 each am x 2 days,  1 each am x 2 days and stop    If not all better then ov with me or NP  in one week

## 2020-08-25 NOTE — Telephone Encounter (Signed)
Pt went to pick up meds. Pharmacy didn't have everything. Wants to make sure she is doing things properly. Pt. Phone number is [phone].

## 2020-08-27 ENCOUNTER — Other Ambulatory Visit: Payer: Self-pay | Admitting: Internal Medicine

## 2020-08-27 DIAGNOSIS — R058 Other specified cough: Secondary | ICD-10-CM

## 2020-08-27 MED ORDER — FAMOTIDINE 20 MG PO TABS: ORAL_TABLET | ORAL | 5 refills | Status: DC

## 2020-08-28 DIAGNOSIS — N939 Abnormal uterine and vaginal bleeding, unspecified: Secondary | ICD-10-CM | POA: Diagnosis not present

## 2020-09-09 ENCOUNTER — Other Ambulatory Visit: Payer: Self-pay | Admitting: Obstetrics and Gynecology

## 2020-09-17 ENCOUNTER — Encounter (HOSPITAL_BASED_OUTPATIENT_CLINIC_OR_DEPARTMENT_OTHER): Payer: Self-pay | Admitting: Obstetrics and Gynecology

## 2020-09-17 ENCOUNTER — Other Ambulatory Visit: Payer: Self-pay

## 2020-09-17 NOTE — Progress Notes (Addendum)
Spoke w/ via phone for pre-op interview---pt Lab needs dos----  Cbc urine poct             Lab results------none COVID test ------rapid covid dos arrive 930 am [facility] aware Arrive at -------930 am 09-22-2020 NPO after MN NO Solid Food.  Clear liquids from MN until---1030 am then npo Medications to take morning of surgery -----gabapentin, pantaprazole, norethidrone prn Diabetic medication -----n/a Patient Special Instructions -----none Pre-Op special Istructions -----none Patient verbalized understanding of instructions that were given at this phone interview. Patient denies shortness of breath, chest pain, fever, cough at this phone interview.  [facility] brian mack np 06-11-2020  Pt will be in black audi sedan for rapid covid test dos and will call front desk when arrives

## 2020-09-22 ENCOUNTER — Ambulatory Visit (HOSPITAL_BASED_OUTPATIENT_CLINIC_OR_DEPARTMENT_OTHER): Payer: BC Managed Care – PPO | Admitting: Anesthesiology

## 2020-09-22 ENCOUNTER — Ambulatory Visit (HOSPITAL_BASED_OUTPATIENT_CLINIC_OR_DEPARTMENT_OTHER)
Admission: RE | Admit: 2020-09-22 | Discharge: 2020-09-22 | Disposition: A | Discharge status: Home / Self Care | Payer: BC Managed Care – PPO | Attending: Obstetrics and Gynecology | Admitting: Obstetrics and Gynecology

## 2020-09-22 ENCOUNTER — Encounter (HOSPITAL_BASED_OUTPATIENT_CLINIC_OR_DEPARTMENT_OTHER): Payer: Self-pay | Admitting: Obstetrics and Gynecology

## 2020-09-22 ENCOUNTER — Other Ambulatory Visit: Payer: Self-pay

## 2020-09-22 ENCOUNTER — Encounter (HOSPITAL_BASED_OUTPATIENT_CLINIC_OR_DEPARTMENT_OTHER)
Admission: RE | Disposition: A | Discharge status: Home / Self Care | Payer: Self-pay | Source: Home / Self Care | Attending: Obstetrics and Gynecology

## 2020-09-22 DIAGNOSIS — N840 Polyp of corpus uteri: Secondary | ICD-10-CM | POA: Insufficient documentation

## 2020-09-22 DIAGNOSIS — Z8616 Personal history of COVID-19: Secondary | ICD-10-CM | POA: Insufficient documentation

## 2020-09-22 DIAGNOSIS — N921 Excessive and frequent menstruation with irregular cycle: Secondary | ICD-10-CM | POA: Insufficient documentation

## 2020-09-22 DIAGNOSIS — Z793 Long term (current) use of hormonal contraceptives: Secondary | ICD-10-CM | POA: Diagnosis not present

## 2020-09-22 DIAGNOSIS — G43909 Migraine, unspecified, not intractable, without status migrainosus: Secondary | ICD-10-CM | POA: Diagnosis not present

## 2020-09-22 DIAGNOSIS — K219 Gastro-esophageal reflux disease without esophagitis: Secondary | ICD-10-CM | POA: Diagnosis not present

## 2020-09-22 DIAGNOSIS — Z79899 Other long term (current) drug therapy: Secondary | ICD-10-CM | POA: Insufficient documentation

## 2020-09-22 DIAGNOSIS — N939 Abnormal uterine and vaginal bleeding, unspecified: Secondary | ICD-10-CM | POA: Diagnosis not present

## 2020-09-22 HISTORY — DX: Chronic cough: R05.3

## 2020-09-22 HISTORY — PX: DILITATION & CURRETTAGE/HYSTROSCOPY WITH NOVASURE ABLATION: SHX5568

## 2020-09-22 HISTORY — DX: Excessive and frequent menstruation with regular cycle: N92.0

## 2020-09-22 LAB — RESP PANEL BY RT-PCR (FLU A&B, COVID) ARPGX2
Influenza A by PCR: NEGATIVE
Influenza B by PCR: NEGATIVE
SARS Coronavirus 2 by RT PCR: NEGATIVE

## 2020-09-22 LAB — POCT PREGNANCY, URINE
Preg Test, Ur: NEGATIVE
Preg Test, Ur: NEGATIVE

## 2020-09-22 LAB — CBC
HCT: 35.7 % — ABNORMAL LOW (ref 36.0–46.0)
Hemoglobin: 11.2 g/dL — ABNORMAL LOW (ref 12.0–15.0)
MCH: 28.6 pg (ref 26.0–34.0)
MCHC: 31.4 g/dL (ref 30.0–36.0)
MCV: 91.1 fL (ref 80.0–100.0)
Platelets: 335 10*3/uL (ref 150–400)
RBC: 3.92 MIL/uL (ref 3.87–5.11)
RDW: 12 % (ref 11.5–15.5)
WBC: 4.9 10*3/uL (ref 4.0–10.5)
nRBC: 0 % (ref 0.0–0.2)

## 2020-09-22 SURGERY — DILATATION & CURETTAGE/HYSTEROSCOPY WITH NOVASURE ABLATION
Anesthesia: General

## 2020-09-22 MED ORDER — AMISULPRIDE (ANTIEMETIC) 5 MG/2ML IV SOLN: 10.0000 mg | Freq: Once | INTRAVENOUS | Status: DC | PRN

## 2020-09-22 MED ORDER — KETOROLAC TROMETHAMINE 30 MG/ML IJ SOLN
INTRAMUSCULAR | Status: AC
  Filled 2020-09-22: qty 1

## 2020-09-22 MED ORDER — SODIUM CHLORIDE 0.9 % IR SOLN
Status: DC | PRN
  Administered 2020-09-22: 3000 mL

## 2020-09-22 MED ORDER — KETOROLAC TROMETHAMINE 30 MG/ML IJ SOLN
INTRAMUSCULAR | Status: DC | PRN
  Administered 2020-09-22: 30 mg via INTRAVENOUS

## 2020-09-22 MED ORDER — BUPIVACAINE HCL (PF) 0.25 % IJ SOLN
INTRAMUSCULAR | Status: DC | PRN
  Administered 2020-09-22: 20 mL

## 2020-09-22 MED ORDER — LIDOCAINE 2% (20 MG/ML) 5 ML SYRINGE
INTRAMUSCULAR | Status: DC | PRN
  Administered 2020-09-22: 100 mg via INTRAVENOUS

## 2020-09-22 MED ORDER — PROPOFOL 10 MG/ML IV BOLUS
INTRAVENOUS | Status: AC
  Filled 2020-09-22: qty 20

## 2020-09-22 MED ORDER — HYDROMORPHONE HCL 1 MG/ML IJ SOLN: 0.2500 mg | INTRAMUSCULAR | Status: DC | PRN

## 2020-09-22 MED ORDER — POVIDONE-IODINE 10 % EX SWAB: 2.0000 "application " | Freq: Once | CUTANEOUS | Status: DC

## 2020-09-22 MED ORDER — SCOPOLAMINE 1 MG/3DAYS TD PT72
MEDICATED_PATCH | TRANSDERMAL | Status: DC | PRN
  Administered 2020-09-22: 1 via TRANSDERMAL

## 2020-09-22 MED ORDER — LIDOCAINE 2% (20 MG/ML) 5 ML SYRINGE
INTRAMUSCULAR | Status: AC
  Filled 2020-09-22: qty 5

## 2020-09-22 MED ORDER — FENTANYL CITRATE (PF) 100 MCG/2ML IJ SOLN
INTRAMUSCULAR | Status: DC | PRN
  Administered 2020-09-22 (×2): 50 ug via INTRAVENOUS

## 2020-09-22 MED ORDER — VASOPRESSIN 20 UNIT/ML IV SOLN
INTRAVENOUS | Status: DC | PRN
  Administered 2020-09-22: 20 mL via INTRAMUSCULAR

## 2020-09-22 MED ORDER — ONDANSETRON HCL 4 MG/2ML IJ SOLN
INTRAMUSCULAR | Status: AC
  Filled 2020-09-22: qty 2

## 2020-09-22 MED ORDER — HYDROCODONE-ACETAMINOPHEN 5-325 MG PO TABS: 1.0000 | ORAL_TABLET | Freq: Four times a day (QID) | ORAL | 0 refills | Status: DC | PRN

## 2020-09-22 MED ORDER — PROPOFOL 10 MG/ML IV BOLUS
INTRAVENOUS | Status: DC | PRN
  Administered 2020-09-22: 150 mg via INTRAVENOUS

## 2020-09-22 MED ORDER — DEXAMETHASONE SODIUM PHOSPHATE 10 MG/ML IJ SOLN
INTRAMUSCULAR | Status: DC | PRN
  Administered 2020-09-22 (×2): 5 mg via INTRAVENOUS

## 2020-09-22 MED ORDER — MIDAZOLAM HCL 2 MG/2ML IJ SOLN
INTRAMUSCULAR | Status: AC
  Filled 2020-09-22: qty 2

## 2020-09-22 MED ORDER — FENTANYL CITRATE (PF) 100 MCG/2ML IJ SOLN
INTRAMUSCULAR | Status: AC
  Filled 2020-09-22: qty 2

## 2020-09-22 MED ORDER — WHITE PETROLATUM EX OINT
TOPICAL_OINTMENT | CUTANEOUS | Status: AC
  Filled 2020-09-22: qty 5

## 2020-09-22 MED ORDER — SCOPOLAMINE 1 MG/3DAYS TD PT72
MEDICATED_PATCH | TRANSDERMAL | Status: AC
  Filled 2020-09-22: qty 1

## 2020-09-22 MED ORDER — CEFAZOLIN SODIUM-DEXTROSE 2-4 GM/100ML-% IV SOLN
2.0000 g | INTRAVENOUS | Status: AC
  Administered 2020-09-22: 2 g via INTRAVENOUS

## 2020-09-22 MED ORDER — CEFAZOLIN SODIUM-DEXTROSE 2-4 GM/100ML-% IV SOLN
INTRAVENOUS | Status: AC
  Filled 2020-09-22: qty 100

## 2020-09-22 MED ORDER — DEXAMETHASONE SODIUM PHOSPHATE 10 MG/ML IJ SOLN
INTRAMUSCULAR | Status: AC
  Filled 2020-09-22: qty 1

## 2020-09-22 MED ORDER — ONDANSETRON HCL 4 MG/2ML IJ SOLN
INTRAMUSCULAR | Status: DC | PRN
  Administered 2020-09-22: 4 mg via INTRAVENOUS

## 2020-09-22 MED ORDER — LACTATED RINGERS IV SOLN
INTRAVENOUS | Status: DC
  Administered 2020-09-22: 1000 mL via INTRAVENOUS

## 2020-09-22 MED ORDER — MIDAZOLAM HCL 2 MG/2ML IJ SOLN
INTRAMUSCULAR | Status: DC | PRN
  Administered 2020-09-22: 2 mg via INTRAVENOUS

## 2020-09-22 MED ORDER — MEPERIDINE HCL 25 MG/ML IJ SOLN: 6.2500 mg | INTRAMUSCULAR | Status: DC | PRN

## 2020-09-22 SURGICAL SUPPLY — 17 items
ABLATOR SURESOUND NOVASURE (ABLATOR) ×2 IMPLANT
CATH ROBINSON RED A/P 16FR (CATHETERS) IMPLANT
DEVICE MYOSURE REACH (MISCELLANEOUS) ×2 IMPLANT
GLOVE SURG ENC MOIS LTX SZ7.5 (GLOVE) ×2 IMPLANT
GLOVE SURG UNDER POLY LF SZ7 (GLOVE) ×2 IMPLANT
GOWN STRL REUS W/TWL LRG LVL3 (GOWN DISPOSABLE) ×4 IMPLANT
HIBICLENS CHG 4% 4OZ (MISCELLANEOUS) IMPLANT
IV NS IRRIG 3000ML ARTHROMATIC (IV SOLUTION) ×2 IMPLANT
KIT PROCEDURE FLUENT (KITS) ×2 IMPLANT
KIT TURNOVER CYSTO (KITS) ×2 IMPLANT
NEEDLE HYPO 22GX1.5 SAFETY (NEEDLE) ×2 IMPLANT
PACK VAGINAL MINOR WOMEN LF (CUSTOM PROCEDURE TRAY) ×2 IMPLANT
PAD OB MATERNITY 4.3X12.25 (PERSONAL CARE ITEMS) ×2 IMPLANT
PAD PREP 24X48 CUFFED NSTRL (MISCELLANEOUS) ×2 IMPLANT
SEAL ROD LENS SCOPE MYOSURE (ABLATOR) ×2 IMPLANT
SYR CONTROL 10ML LL (SYRINGE) ×2 IMPLANT
TOWEL OR 17X26 10 PK STRL BLUE (TOWEL DISPOSABLE) ×2 IMPLANT

## 2020-09-22 NOTE — Discharge Instructions (Signed)

## 2020-09-22 NOTE — Progress Notes (Signed)
Patient seen and examined. Consent witnessed and signed. No changes noted. Update completed.Patient seen and examined. Consent witnessed and signed. No changes noted. Update completed. BP (!) 146/82   Pulse 77   Temp 98.2 F (36.8 C)   Resp 16   Ht 5\' 6"  (1.676 m)   Wt 79.5 kg   LMP 06/19/2020 Comment: lasted up until about 10 days ago,.  SpO2 100%   BMI 28.29 kg/m   CBC    Component Value Date/Time   WBC 4.9 09/22/2020 1200   RBC 3.92 09/22/2020 1200   HGB 11.2 (L) 09/22/2020 1200   HCT 35.7 (L) 09/22/2020 1200   PLT 335 09/22/2020 1200   MCV 91.1 09/22/2020 1200   MCH 28.6 09/22/2020 1200   MCHC 31.4 09/22/2020 1200   RDW 12.0 09/22/2020 1200   LYMPHSABS 1.7 05/27/2020 1005   MONOABS 0.4 05/27/2020 1005   EOSABS 0.2 05/27/2020 1005   BASOSABS 0.1 05/27/2020 1005

## 2020-09-22 NOTE — Transfer of Care (Signed)
Immediate Anesthesia Transfer of Care Note  Patient: Monica Bennett  Procedure(s) Performed: Procedure(s) (LRB): DILATATION & CURETTAGE/HYSTEROSCOPY WITH NOVASURE ABLATION and myosure (N/A)  Patient Location: PACU  Anesthesia Type: General  Level of Consciousness: awake, oriented, sedated and patient cooperative  Airway & Oxygen Therapy: Patient Spontanous Breathing and Patient connected to face mask oxygen  Post-op Assessment: Report given to PACU RN and Post -op Vital signs reviewed and stable  Post vital signs: Reviewed and stable  Complications: No apparent anesthesia complications Last Vitals:  Vitals Value Taken Time  BP 112/77 09/22/20 1420  Temp    Pulse 62 09/22/20 1424  Resp 10 09/22/20 1424  SpO2 100 % 09/22/20 1424  Vitals shown include unvalidated device data.  Last Pain:  Vitals:   09/22/20 1136  PainSc: 0-No pain      Patients Stated Pain Goal: 7 (46/27/03 5009)  Complications: No complications documented.

## 2020-09-22 NOTE — Anesthesia Procedure Notes (Signed)
Procedure Name: LMA Insertion Date/Time: 09/22/2020 1:46 PM Performed by: Suan Halter, CRNA Pre-anesthesia Checklist: Patient identified, Emergency Drugs available, Suction available and Patient being monitored Patient Re-evaluated:Patient Re-evaluated prior to induction Oxygen Delivery Method: Circle system utilized Preoxygenation: Pre-oxygenation with 100% oxygen Induction Type: IV induction Ventilation: Mask ventilation without difficulty LMA: LMA inserted LMA Size: 4.0 Number of attempts: 1 Airway Equipment and Method: Bite block Placement Confirmation: positive ETCO2 Tube secured with: Tape Dental Injury: Teeth and Oropharynx as per pre-operative assessment

## 2020-09-22 NOTE — Op Note (Signed)
09/22/2020  2:08 PM  PATIENT:  Shelton Silvas  50 y.o. female  PRE-OPERATIVE DIAGNOSIS:  Menorrhagia  POST-OPERATIVE DIAGNOSIS:  ENDOMETRIAL POLYP  PROCEDURE:  Procedure(s): DILATATION & CURETTAGE HYSTEROSCOPY WITH NOVASURE ABLATION MYOSURE RESECTION OF ENDOMETRIAL POLYP  SURGEON:  Surgeon(s): Brien Few, MD  ASSISTANTS: none   ANESTHESIA:   local and general  ESTIMATED BLOOD LOSS: MINIMAL  DRAINS: none   LOCAL MEDICATIONS USED:  MARCAINE    and Amount: 20 ml  SPECIMEN:  Source of Specimen:  emc AND POLYP  DISPOSITION OF SPECIMEN:  PATHOLOGY  COUNTS:  YES  DICTATION #: [id number]  PLAN OF CARE: DC HOME  PATIENT DISPOSITION:  PACU - hemodynamically stable.

## 2020-09-22 NOTE — Anesthesia Preprocedure Evaluation (Addendum)
Anesthesia Evaluation  Patient identified by MRN, date of birth, ID band Patient awake    Reviewed: Allergy & Precautions, NPO status , Patient's Chart, lab work & pertinent test results  Airway Mallampati: II  TM Distance: >3 FB Neck ROM: Full    Dental  (+) Dental Advisory Given   Pulmonary neg pulmonary ROS,    Pulmonary exam normal breath sounds clear to auscultation       Cardiovascular negative cardio ROS Normal cardiovascular exam Rhythm:Regular Rate:Normal     Neuro/Psych  Headaches,    GI/Hepatic Neg liver ROS, GERD  ,  Endo/Other  negative endocrine ROS  Renal/GU negative Renal ROS     Musculoskeletal negative musculoskeletal ROS (+)   Abdominal   Peds  Hematology negative hematology ROS (+)   Anesthesia Other Findings   Reproductive/Obstetrics                            Anesthesia Physical Anesthesia Plan  ASA: II  Anesthesia Plan: General   Post-op Pain Management:    Induction: Intravenous  PONV Risk Score and Plan: 4 or greater and Ondansetron, Dexamethasone, Midazolam, Scopolamine patch - Pre-op and Treatment may vary due to age or medical condition  Airway Management Planned: LMA  Additional Equipment: None  Intra-op Plan:   Post-operative Plan: Extubation in OR  Informed Consent: I have reviewed the patients History and Physical, chart, labs and discussed the procedure including the risks, benefits and alternatives for the proposed anesthesia with the patient or authorized representative who has indicated his/her understanding and acceptance.     Dental advisory given  Plan Discussed with: CRNA  Anesthesia Plan Comments:        Anesthesia Quick Evaluation

## 2020-09-22 NOTE — H&P (Signed)
Monica Bennett is an 50 y.o. female. AUB with large fibroid(IM) , SM lesion noted. Declines hysterectomy. Secondary anemia. Expelled IUD.   Pertinent Gynecological History: Menses: flow is moderate Bleeding: dysfunctional uterine bleeding Contraception: vasectomy DES exposure: denies Blood transfusions: none Sexually transmitted diseases: no past history Previous GYN Procedures: nma  Last mammogram: normal Date: 2021 Last pap: normal Date: 2021 OB History: G2, P3   Menstrual History: Menarche age: 59 Patient's last menstrual period was 06/19/2020.    Past Medical History:  Diagnosis Date  . Chronic cough    sees dr wert for  . COVID 08/07/2020   rapid at home test sore lymph node congestion and cough x 2 days all symptoms resolved  . GERD (gastroesophageal reflux disease)   . Menorrhagia   . Migraines     Past Surgical History:  Procedure Laterality Date  . colonscopy  last done 2017    Family History  Problem Relation Age of Onset  . Cancer Maternal Grandmother   . Heart disease Maternal Grandfather   . Cancer Paternal Grandmother   . Cancer Paternal Grandfather     Social History:  reports that she has never smoked. She has never used smokeless tobacco. She reports that she does not drink alcohol and does not use drugs.  Allergies: No Known Allergies  Medications Prior to Admission  Medication Sig Dispense Refill Last Dose  . Ascorbic Acid (VITAMIN C) 1000 MG tablet Take 1,000 mg by mouth daily.   Past Month at Unknown time  . diphenhydramine-acetaminophen (TYLENOL PM) 25-500 MG TABS tablet Take 1 tablet by mouth at bedtime as needed.   09/20/2020  . famotidine (PEPCID) 20 MG tablet One after supper 30 tablet 5 09/21/2020 at Unknown time  . gabapentin (NEURONTIN) 100 MG capsule Take 1 capsule (100 mg total) by mouth 3 (three) times daily. One three times daily 120 capsule 2 09/22/2020 at Unknown time  . ibuprofen (ADVIL) 200 MG tablet Take 200 mg by mouth every 6 (six)  hours as needed. Takes 2 of 200 mg prn   09/21/2020 at Unknown time  . norethindrone (AYGESTIN) 5 MG tablet Take by mouth daily as needed.   09/16/2020  . pantoprazole (PROTONIX) 40 MG tablet TAKE 1 TABLET (40 MG TOTAL) BY MOUTH DAILY. TAKE 30-60 MIN BEFORE FIRST MEAL OF THE DAY 90 tablet 3 09/22/2020 at Unknown time    Review of Systems  Constitutional: Negative.   All other systems reviewed and are negative.   Blood pressure (!) 146/82, pulse 77, temperature 98.2 F (36.8 C), resp. rate 16, height 5\' 6"  (1.676 m), weight 79.5 kg, last menstrual period 06/19/2020, SpO2 100 %. Physical Exam Vitals reviewed.  Constitutional:      Appearance: Normal appearance. She is normal weight.  HENT:     Head: Normocephalic and atraumatic.  Cardiovascular:     Rate and Rhythm: Normal rate and regular rhythm.     Pulses: Normal pulses.     Heart sounds: Normal heart sounds.  Pulmonary:     Effort: Pulmonary effort is normal.     Breath sounds: Normal breath sounds.  Abdominal:     General: Abdomen is flat. Bowel sounds are normal.     Palpations: Abdomen is soft.  Genitourinary:    General: Normal vulva.  Musculoskeletal:        General: Normal range of motion.     Cervical back: Normal range of motion and neck supple.  Skin:    General: Skin is warm  and dry.  Neurological:     General: No focal deficit present.     Mental Status: She is alert.  Psychiatric:        Mood and Affect: Mood normal.        Behavior: Behavior normal.    CBC    Component Value Date/Time   WBC 6.2 05/27/2020 1005   RBC 4.54 05/27/2020 1005   HGB 14.7 05/27/2020 1005   HCT 42.1 05/27/2020 1005   PLT 292.0 05/27/2020 1005   MCV 92.6 05/27/2020 1005   MCHC 34.9 05/27/2020 1005   RDW 13.1 05/27/2020 1005   LYMPHSABS 1.7 05/27/2020 1005   MONOABS 0.4 05/27/2020 1005   EOSABS 0.2 05/27/2020 1005   BASOSABS 0.1 05/27/2020 1005    Results for orders placed or performed during the hospital encounter of  09/22/20 (from the past 24 hour(s))  Resp Panel by RT-PCR (Flu A&B, Covid) Nasopharyngeal Swab     Status: None   Collection Time: 09/22/20  9:36 AM   Specimen: Nasopharyngeal Swab; Nasopharyngeal(NP) swabs in vial transport medium  Result Value Ref Range   SARS Coronavirus 2 by RT PCR NEGATIVE NEGATIVE   Influenza A by PCR NEGATIVE NEGATIVE   Influenza B by PCR NEGATIVE NEGATIVE  Pregnancy, urine POC     Status: None   Collection Time: 09/22/20 11:31 AM  Result Value Ref Range   Preg Test, Ur NEGATIVE NEGATIVE  Pregnancy, urine POC     Status: None   Collection Time: 09/22/20 11:32 AM  Result Value Ref Range   Preg Test, Ur NEGATIVE NEGATIVE    No results found.  Assessment/Plan: AUB with ? Structural lesion in endo 9cm IM fibroid Declines hysterectomy Diag HS, D&C, Poss myosure, EAB. Risks of anesthesia , infection, bleeding , injury to surrounding organs with need for repair discussed. Pt acknowledges, consent done. Desires to proceed.   Venus Gilles J 09/22/2020, 12:12 PM

## 2020-09-23 ENCOUNTER — Encounter (HOSPITAL_BASED_OUTPATIENT_CLINIC_OR_DEPARTMENT_OTHER): Payer: Self-pay | Admitting: Obstetrics and Gynecology

## 2020-09-23 LAB — SURGICAL PATHOLOGY

## 2020-09-23 NOTE — Anesthesia Postprocedure Evaluation (Signed)
Anesthesia Post Note  Patient: Monica Bennett  Procedure(s) Performed: DILATATION & CURETTAGE/HYSTEROSCOPY WITH NOVASURE ABLATION and myosure (N/A )     Patient location during evaluation: PACU Anesthesia Type: General Level of consciousness: sedated and patient cooperative Pain management: pain level controlled Vital Signs Assessment: post-procedure vital signs reviewed and stable Respiratory status: spontaneous breathing Cardiovascular status: stable Anesthetic complications: no   No complications documented.  Last Vitals:  Vitals:   09/22/20 1500 09/22/20 1536  BP: 129/80 134/87  Pulse: 78 (!) 56  Resp: 12 14  Temp:  36.6 C  SpO2: 100% 100%    Last Pain:  Vitals:   09/23/20 1109  PainSc: 0-No pain                 Nolon Nations

## 2020-09-23 NOTE — Op Note (Signed)
Monica Bennett, SHIFFER MEDICAL RECORD NO: [id number] ACCOUNT NO: [id number] DATE OF BIRTH: 11-18-1970 FACILITY: [facility] LOCATION: [facility] PHYSICIAN: Lovenia Kim, MD  Operative Report   DATE OF PROCEDURE: 09/22/2020  PREOPERATIVE DIAGNOSIS:  Menometrorrhagia with questionable structural lesion.  POSTOPERATIVE DIAGNOSIS:  Menometrorrhagia with questionable structural lesion.  PROCEDURE:  Diagnostic hysteroscopy, D and C, MyoSure resection of anterior wall endometrial polyp and NovaSure endometrial ablation.  SURGEON:  Lovenia Kim, MD.  ASSISTANT:  None.  ANESTHESIA:  General and local.  ESTIMATED BLOOD LOSS:  Less than 50 mL.  DISPOSITION:  The patient to recovery in good condition.  BRIEF OPERATIVE NOTE:  After being apprised of the risks of anesthesia, infection, bleeding, damage to surrounding organs, possible need to repair, delayed versus immediate complications to include bowel and bladder injury, possible need for repair, the  patient was brought to the operating room where she was administered general anesthetic without complications.  She was prepped and draped in usual sterile fashion.  Catheterized until the bladder was empty.  Exam under anesthesia reveals an anteflexed  uterus with known uterine fibroid, approximately 8 cm anterior lesion.  Cervix is then sounded to reveal an anteflexed uterus that is deviated to the right.  Cervix was then easily dilated up to a #21 Pratt dilator, after dilute Marcaine solution was  placed with 20 mL of paracervical block and dilute Pitressin solution was placed at 3 and 9 o'clock of 18 mL total.  A hysteroscope was placed.  Visualization reveals an anterior wall endometrial polyp near the right cornual area near the right tubal  ostia, which was resected using the MyoSure device in multiple passes without difficulty.  Otherwise, cavity appears clear.  At this time, NovaSure device was seated to a length of 6.5 and a width of 4.5  and after a negative CO2 test was initiated for 43  seconds without difficulty.  Repeat inspection of endometrial cavity reveals no evidence of uterine perforation.  A well-ablated cavity with a small stripe of unablated area up at the fundus, otherwise no evidence of perforation.  Procedure was  terminated.  The patient tolerated the procedure well, was awakened and transferred to recovery in good condition.   PAA D: 09/22/2020 9:25:01 pm T: 09/23/2020 7:20:00 am  JOB: [id number]/ [id number]

## 2020-10-02 ENCOUNTER — Ambulatory Visit: Payer: BC Managed Care – PPO | Admitting: Internal Medicine

## 2020-10-03 ENCOUNTER — Ambulatory Visit: Payer: BC Managed Care – PPO | Admitting: Internal Medicine

## 2020-10-09 ENCOUNTER — Ambulatory Visit: Payer: BC Managed Care – PPO | Admitting: Internal Medicine

## 2020-10-14 ENCOUNTER — Other Ambulatory Visit: Payer: Self-pay

## 2020-10-14 ENCOUNTER — Ambulatory Visit (INDEPENDENT_AMBULATORY_CARE_PROVIDER_SITE_OTHER): Payer: BC Managed Care – PPO

## 2020-10-14 ENCOUNTER — Encounter: Payer: Self-pay | Admitting: Internal Medicine

## 2020-10-14 ENCOUNTER — Ambulatory Visit (INDEPENDENT_AMBULATORY_CARE_PROVIDER_SITE_OTHER): Payer: BC Managed Care – PPO | Admitting: Internal Medicine

## 2020-10-14 DIAGNOSIS — R058 Other specified cough: Secondary | ICD-10-CM

## 2020-10-14 DIAGNOSIS — R059 Cough, unspecified: Secondary | ICD-10-CM | POA: Diagnosis not present

## 2020-10-14 MED ORDER — GABAPENTIN 300 MG PO CAPS: 300.0000 mg | ORAL_CAPSULE | Freq: Three times a day (TID) | ORAL | 2 refills | Status: DC

## 2020-10-14 MED ORDER — PREDNISONE 10 MG PO TABS: ORAL_TABLET | ORAL | 0 refills | Status: DC

## 2020-10-14 NOTE — Assessment & Plan Note (Signed)
Onset:  All her life when exp to perfumes/ dad had same problem - much worse p IUP x twins in 2005 on  [place] since at least 2005 with eval by allergy, ent, gi dx gerd but not better on acid suppression or low dose gabapentin - cxr 03/05/20 report wnl / Jasmine December radiologist - Allergy profile 05/27/2020 >  Eos 0.2 /  IgE  20 - cyclical cough rx 92/03/2445  06/11/2020-follow-up in office, cough had significantly improved, felt to be prednisone responsive, also improved with gabapentin 100 mg 3 times daily - jan 2022 flared with covid on gabapentin 100 tid/gerd rx  > rec pred x 6 and gabapentin titrate up to 300 tid   Of the three most common causes of  Sub-acute / recurrent or chronic cough, only one (GERD)  can actually contribute to/ trigger  the other two (asthma and post nasal drip syndrome)  and perpetuate the cylce of cough.  While not intuitively obvious, many patients with chronic low grade reflux do not cough until there is a primary insult that disturbs the protective epithelial barrier and exposes sensitive nerve endings.   This is typically viral but can due to PNDS and  either may apply here.     >>> The point is that once this occurs, it is difficult to eliminate the cycle  using anything but a maximally effective acid suppression regimen at least in the short run, accompanied by an appropriate diet to address non acid GERD and control / eliminate the cough itself  With gabapentin titrate as high as 300 mg tid if that's what it takes   >>>f /u in 6 weeks          Each maintenance medication was reviewed in detail including emphasizing most importantly the difference between maintenance and prns and under what circumstances the prns are to be triggered using an action plan format where appropriate.  Total time for H and P, chart review, counseling,   and generating customized AVS unique to this office visit / same day charting = 22 min

## 2020-10-14 NOTE — Patient Instructions (Addendum)
Increase gabapentin 100 mg  X 2   Three times daily and after a week 300 mg three times daily   Continue Pantoprazole (protonix) 40 mg   Take  30-60 min before first meal of the day and Pepcid (famotidine)  20 mg one @  bedtime until return to office - this is the best way to tell whether stomach acid is contributing to your problem.    Eat big meal at lunch, small meal around 6 pm   GERD (REFLUX)  is an extremely common cause of respiratory symptoms just like yours , many times with no obvious heartburn at all.    It can be treated with medication, but also with lifestyle changes including elevation of the head of your bed (ideally with 6 -8inch blocks under the headboard of your bed),  Smoking cessation, avoidance of late meals, excessive alcohol, and avoid fatty foods, chocolate, peppermint, colas, red wine, and acidic juices such as orange juice.  NO MINT OR MENTHOL PRODUCTS SO NO COUGH DROPS  USE SUGARLESS CANDY INSTEAD (Jolley ranchers or Stover's or Life Savers) or even ice chips will also do - the key is to swallow to prevent all throat clearing. NO OIL BASED VITAMINS - use powdered substitutes.  Avoid fish oil when coughing.      Prednisone 10 mg take  4 each am x 2 days,   2 each am x 2 days,  1 each am x 2 days and stop   Please remember to go to the  x-ray department  for your tests - we will call you with the results when they are available    Please schedule a follow up office visit in 6 weeks, call sooner if needed

## 2020-10-14 NOTE — Progress Notes (Signed)
Monica Bennett, female    DOB: 06/24/1971     MRN: [id number]   Brief patient profile:  14 yowf never smoker never any problems other than with cough p exp to strong perfume (dad same problem)  until around 2007 which was 2 years p twins born- IUP  was notable for significant reflux and improved but never resolved assoc with sense of throat tickle despite allergy evaluation at Monica Bennett neg >>>    EGD Monica Bennett "confirmed GERD" but no improvement on acid suppression/ gabapentin 100 tid then ENT eval  Nl exam 05/01/20 > recommend avoid caffeine > no better so referred to pulmonary clinic 05/27/2020 by Monica   Ashby Bennett.    "Kicked out of cough study" in Monica Bennett s taking the study drug   History of Present Illness  05/27/2020  Pulmonary/ 1st office eval/Monica Bennett around year 6 of rx ? On bcp's prior - "can't remember" Chief Complaint  Patient presents with  . Pulmonary Consult    Referred by Monica Monica Bennett. Pt c/o cough x 10 years, worse since May 2021. She states when she exercises she gets coughing spells that won't stop. Her cough is non prod.   Dyspnea:  Feels heaviness with exercise since Spring 2021 also assoc with menorrhagia  Cough: Dry  Mostly daytime worse when speaks / better when lies down and sleeps / worse with mint toothpaste / perfume Sleep: fine flat  SABA use: inhalers Urinary incont assoc, gags but never vomits rec The key to effective treatment for your cough is eliminating the non-stop cycle of cough you're stuck in long enough to let your airway heal completely and then see if there is anything still making you cough once you stop the cough suppression, but this should take no more than 5 days to figure out Gabapentin 100  Mg  Three times daily gradually increase to 300 mg three times days  Prednisone 10 mg take  4 each am x 2 days,   2 each am x 2 days,  1 each am x 2 days and stop (this is to eliminate allergies and inflammation from coughing) Protonix  (pantoprazole) Take 30-60 min before first meal of the day and Pepcid 20 mg one bedtime plus chlorpheniramine 4 mg x 2 at bedtime For drainage / throat tickle try take CHLORPHENIRAMINE  4 mg  (Chlortab 4mg   at Monica Bennett should be easiest to find in the green box) GERD diet    10/14/2020  f/u ov/Monica Bennett re:  Satisfied cough gone prior to covid 19 Aug 08 2020 and worse cough assoc  Chief Complaint  Patient presents with  . Follow-up    Cough worse since had covid 06 Aug 2020- dry cough, esp worse at night.   Dyspnea:  Only if coughing  Cough: dry/ worse around 8 pm when eat and bed by 10  Sleeping: settles down p sleep s h1  SABA use: none  02: none  Covid status: x 3 vax and had the virus    No obvious day to day or daytime variability or assoc excess/ purulent sputum or mucus plugs or hemoptysis or cp or chest tightness, subjective wheeze or overt sinus or hb symptoms.   Sleeping as abov e  without nocturnal  or early am exacerbation  of respiratory  c/o's or need for noct saba. Also denies any obvious fluctuation of symptoms with weather or environmental changes or other aggravating or alleviating factors except as outlined above  No unusual exposure hx or h/o childhood pna/ asthma or knowledge of premature birth.  Current Allergies, Complete Past Medical History, Past Surgical History, Family History, and Social History were reviewed in [employer] record.  ROS  The following are not active complaints unless bolded Hoarseness, sore throat, dysphagia, dental problems, itching, sneezing,  nasal congestion or discharge of excess mucus or purulent secretions, ear ache,   fever, chills, sweats, unintended wt loss or wt gain, classically pleuritic or exertional cp,  orthopnea pnd or arm/hand swelling  or leg swelling, presyncope, palpitations, abdominal pain, anorexia, nausea, vomiting, diarrhea  or change in bowel habits or change in bladder habits, change in  stools or change in urine, dysuria, hematuria,  rash, arthralgias, visual complaints, headache, numbness, weakness or ataxia or problems with walking or coordination,  change in mood or  memory.        Current Meds  Medication Sig  . Ascorbic Acid (VITAMIN C) 1000 MG tablet Take 1,000 mg by mouth daily.  . diphenhydramine-acetaminophen (TYLENOL PM) 25-500 MG TABS tablet Take 1 tablet by mouth at bedtime as needed.  . famotidine (PEPCID) 20 MG tablet One after supper  . gabapentin (NEURONTIN) 100 MG capsule Take 1 capsule (100 mg total) by mouth 3 (three) times daily. One three times daily  . ibuprofen (ADVIL) 200 MG tablet Take 200 mg by mouth every 6 (six) hours as needed. Takes 2 of 200 mg prn  . pantoprazole (PROTONIX) 40 MG tablet TAKE 1 TABLET (40 MG TOTAL) BY MOUTH DAILY. TAKE 30-60 MIN BEFORE FIRST MEAL OF THE DAY                Past Medical History:  Diagnosis Date  . GERD (gastroesophageal reflux disease)   . Migraines       Objective:     Wt Readings from Last 3 Encounters:  10/14/20 173 lb (78.5 kg)  09/22/20 175 lb 4.8 oz (79.5 kg)  06/11/20 174 lb 3.2 oz (79 kg)      Vital signs reviewed  10/14/2020  - Note at rest 02 sats  99% on RA   General appearance:    amb slt hoarse wf/ freq Throat clearing   HEENT : pt wearing mask not removed for exam due to covid -19 concerns.    NECK :  without JVD/Nodes/TM/ nl carotid upstrokes bilaterally   LUNGS: no acc muscle use,  Nl contour chest which is clear to A and P bilaterally with cough  exp maneuvers   CV:  RRR  no s3 or murmur or increase in P2, and no edema   ABD:  soft and nontender with nl inspiratory excursion in the supine position. No bruits or organomegaly appreciated, bowel sounds nl  MS:  Nl gait/ ext warm without deformities, calf tenderness, cyanosis or clubbing No obvious joint restrictions   SKIN: warm and dry without lesions    NEURO:  alert, approp, nl sensorium with  no motor or  cerebellar deficits apparent.             CXR PA and Lateral:   10/14/2020 :    I personally reviewed images /impression as follows:   Minimal increase non-specific markings       Assessment

## 2020-10-16 ENCOUNTER — Encounter: Payer: Self-pay | Admitting: *Deleted

## 2020-10-16 DIAGNOSIS — F419 Anxiety disorder, unspecified: Secondary | ICD-10-CM | POA: Diagnosis not present

## 2020-10-16 DIAGNOSIS — F4310 Post-traumatic stress disorder, unspecified: Secondary | ICD-10-CM | POA: Diagnosis not present

## 2020-10-16 NOTE — Progress Notes (Signed)
Letter mailed

## 2020-10-30 DIAGNOSIS — F4310 Post-traumatic stress disorder, unspecified: Secondary | ICD-10-CM | POA: Diagnosis not present

## 2020-10-30 DIAGNOSIS — F419 Anxiety disorder, unspecified: Secondary | ICD-10-CM | POA: Diagnosis not present

## 2020-11-11 DIAGNOSIS — F4310 Post-traumatic stress disorder, unspecified: Secondary | ICD-10-CM | POA: Diagnosis not present

## 2020-11-11 DIAGNOSIS — F419 Anxiety disorder, unspecified: Secondary | ICD-10-CM | POA: Diagnosis not present

## 2020-11-25 ENCOUNTER — Ambulatory Visit: Payer: BC Managed Care – PPO | Admitting: Internal Medicine

## 2020-11-25 NOTE — Progress Notes (Deleted)
Monica Bennett, female    DOB: Jan 24, 1971     MRN: [id number]   Brief patient profile:  84 yowf never smoker never any problems other than with cough p exp to strong perfume (dad same problem)  until around 2007 which was 2 years p twins born- IUP  was notable for significant reflux and improved but never resolved assoc with sense of throat tickle despite [facility] evaluation [facility] neg >>>    EGD [facility] [place] "confirmed GERD" but no improvement on acid suppression/ gabapentin 100 tid then ENT eval  Nl exam 05/01/20 > recommend avoid caffeine > no better so referred to pulmonary clinic 05/27/2020 by Dr   Ashby Dawes.    "Kicked out of cough study" in [place] s taking the study drug   History of Present Illness  05/27/2020  Pulmonary/ 1st office eval/Levy Wellman on IUD with [facility] around year 6 of rx ? On bcp's prior - "can't remember" Chief Complaint  Patient presents with  . Pulmonary Consult    Referred by Dr Merrilee Seashore. Pt c/o cough x 10 years, worse since May 2021. She states when she exercises she gets coughing spells that won't stop. Her cough is non prod.   Dyspnea:  Feels heaviness with exercise since Spring 2021 also assoc with menorrhagia  Cough: Dry  Mostly daytime worse when speaks / better when lies down and sleeps / worse with mint toothpaste / perfume Sleep: fine flat  SABA use: inhalers Urinary incont assoc, gags but never vomits rec The key to effective treatment for your cough is eliminating the non-stop cycle of cough you're stuck in long enough to let your airway heal completely and then see if there is anything still making you cough once you stop the cough suppression, but this should take no more than 5 days to figure out Gabapentin 100  Mg  Three times daily gradually increase to 300 mg three times days  Prednisone 10 mg take  4 each am x 2 days,   2 each am x 2 days,  1 each am x 2 days and stop (this is to eliminate allergies and inflammation from coughing) Protonix  (pantoprazole) Take 30-60 min before first meal of the day and Pepcid 20 mg one bedtime plus chlorpheniramine 4 mg x 2 at bedtime For drainage / throat tickle try take CHLORPHENIRAMINE  4 mg  (Chlortab 4mg   at [employer] should be easiest to find in the green box) GERD diet    10/14/2020  f/u ov/Timo Hartwig re:  Satisfied cough gone prior to covid 19 Aug 08 2020 and worse cough assoc  Chief Complaint  Patient presents with  . Follow-up    Cough worse since had covid 06 Aug 2020- dry cough, esp worse at night.   Dyspnea:  Only if coughing  Cough: dry/ worse around 8 pm when eat and bed by 10  Sleeping: settles down p sleep s h1  SABA use: none  02: none  Covid status: x 3 vax and had the virus rec  Increase gabapentin 100 mg  X 2   Three times daily and after a week 300 mg three times daily  Continue Pantoprazole (protonix) 40 mg   Take  30-60 min before first meal of the day and Pepcid (famotidine)  20 mg one @  bedtime until return to office - this is the best way to tell whether stomach acid is contributing to your problem.   Eat big meal at lunch, small meal around 6  pm  GERD diet Prednisone 10 mg take  4 each am x 2 days,   2 each am x 2 days,  1 each am x 2 days and stop   11/25/2020  f/u ov/Toula Miyasaki re:  No chief complaint on file.   Dyspnea:  *** Cough: *** Sleeping: *** SABA use: *** 02: *** Covid status:   ***   No obvious day to day or daytime variability or assoc excess/ purulent sputum or mucus plugs or hemoptysis or cp or chest tightness, subjective wheeze or overt sinus or hb symptoms.   *** without nocturnal  or early am exacerbation  of respiratory  c/o's or need for noct saba. Also denies any obvious fluctuation of symptoms with weather or environmental changes or other aggravating or alleviating factors except as outlined above   No unusual exposure hx or h/o childhood pna/ asthma or knowledge of premature birth.  Current Allergies, Complete Past Medical History,  Past Surgical History, Family History, and Social History were reviewed in [employer] record.  ROS  The following are not active complaints unless bolded Hoarseness, sore throat, dysphagia, dental problems, itching, sneezing,  nasal congestion or discharge of excess mucus or purulent secretions, ear ache,   fever, chills, sweats, unintended wt loss or wt gain, classically pleuritic or exertional cp,  orthopnea pnd or arm/hand swelling  or leg swelling, presyncope, palpitations, abdominal pain, anorexia, nausea, vomiting, diarrhea  or change in bowel habits or change in bladder habits, change in stools or change in urine, dysuria, hematuria,  rash, arthralgias, visual complaints, headache, numbness, weakness or ataxia or problems with walking or coordination,  change in mood or  memory.        No outpatient medications have been marked as taking for the 11/25/20 encounter (Appointment) with Tanda Rockers, MD.               Past Medical History:  Diagnosis Date  . GERD (gastroesophageal reflux disease)   . Migraines       Objective:     11/25/2020       ***  10/14/20 173 lb (78.5 kg)  09/22/20 175 lb 4.8 oz (79.5 kg)  06/11/20 174 lb 3.2 oz (79 kg)      Vital signs reviewed  11/25/2020  - Note at rest 02 sats  ***% on ***   General appearance:    ***     cough  exp maneuvers                Assessment

## 2020-11-27 DIAGNOSIS — F4310 Post-traumatic stress disorder, unspecified: Secondary | ICD-10-CM | POA: Diagnosis not present

## 2020-11-27 DIAGNOSIS — F419 Anxiety disorder, unspecified: Secondary | ICD-10-CM | POA: Diagnosis not present

## 2020-12-12 DIAGNOSIS — D649 Anemia, unspecified: Secondary | ICD-10-CM | POA: Diagnosis not present

## 2020-12-21 DIAGNOSIS — J029 Acute pharyngitis, unspecified: Secondary | ICD-10-CM | POA: Diagnosis not present

## 2020-12-21 DIAGNOSIS — J069 Acute upper respiratory infection, unspecified: Secondary | ICD-10-CM | POA: Diagnosis not present

## 2021-03-13 DIAGNOSIS — M67911 Unspecified disorder of synovium and tendon, right shoulder: Secondary | ICD-10-CM | POA: Diagnosis not present

## 2021-03-16 DIAGNOSIS — M25511 Pain in right shoulder: Secondary | ICD-10-CM | POA: Diagnosis not present

## 2021-03-20 DIAGNOSIS — M25511 Pain in right shoulder: Secondary | ICD-10-CM | POA: Diagnosis not present

## 2021-04-16 ENCOUNTER — Ambulatory Visit
Admission: RE | Admit: 2021-04-16 | Discharge: 2021-04-16 | Disposition: A | Discharge status: Home / Self Care | Payer: BC Managed Care – PPO | Source: Ambulatory Visit | Attending: Obstetrics and Gynecology | Admitting: Obstetrics and Gynecology

## 2021-04-16 ENCOUNTER — Other Ambulatory Visit: Payer: Self-pay | Admitting: Obstetrics and Gynecology

## 2021-04-16 ENCOUNTER — Other Ambulatory Visit: Payer: Self-pay

## 2021-04-16 DIAGNOSIS — Z1231 Encounter for screening mammogram for malignant neoplasm of breast: Secondary | ICD-10-CM

## 2021-04-18 HISTORY — PX: SHOULDER SURGERY: SHX246

## 2021-04-20 DIAGNOSIS — M19011 Primary osteoarthritis, right shoulder: Secondary | ICD-10-CM | POA: Diagnosis not present

## 2021-04-20 DIAGNOSIS — M948X1 Other specified disorders of cartilage, shoulder: Secondary | ICD-10-CM | POA: Diagnosis not present

## 2021-04-20 DIAGNOSIS — M75111 Incomplete rotator cuff tear or rupture of right shoulder, not specified as traumatic: Secondary | ICD-10-CM | POA: Diagnosis not present

## 2021-04-20 DIAGNOSIS — M7551 Bursitis of right shoulder: Secondary | ICD-10-CM | POA: Diagnosis not present

## 2021-04-20 DIAGNOSIS — M75101 Unspecified rotator cuff tear or rupture of right shoulder, not specified as traumatic: Secondary | ICD-10-CM | POA: Diagnosis not present

## 2021-04-20 DIAGNOSIS — M67813 Other specified disorders of tendon, right shoulder: Secondary | ICD-10-CM | POA: Diagnosis not present

## 2021-04-20 DIAGNOSIS — G8918 Other acute postprocedural pain: Secondary | ICD-10-CM | POA: Diagnosis not present

## 2021-04-30 ENCOUNTER — Other Ambulatory Visit: Payer: Self-pay | Admitting: Obstetrics and Gynecology

## 2021-04-30 DIAGNOSIS — R928 Other abnormal and inconclusive findings on diagnostic imaging of breast: Secondary | ICD-10-CM

## 2021-05-16 ENCOUNTER — Ambulatory Visit: Payer: BC Managed Care – PPO

## 2021-05-16 ENCOUNTER — Ambulatory Visit
Admission: RE | Admit: 2021-05-16 | Discharge: 2021-05-16 | Disposition: A | Discharge status: Home / Self Care | Payer: BC Managed Care – PPO | Source: Ambulatory Visit | Attending: Obstetrics and Gynecology | Admitting: Obstetrics and Gynecology

## 2021-05-16 DIAGNOSIS — R928 Other abnormal and inconclusive findings on diagnostic imaging of breast: Secondary | ICD-10-CM

## 2021-05-16 DIAGNOSIS — R922 Inconclusive mammogram: Secondary | ICD-10-CM | POA: Diagnosis not present

## 2021-05-18 ENCOUNTER — Other Ambulatory Visit: Payer: BC Managed Care – PPO

## 2021-07-19 DIAGNOSIS — N920 Excessive and frequent menstruation with regular cycle: Secondary | ICD-10-CM

## 2021-07-19 HISTORY — DX: Excessive and frequent menstruation with regular cycle: N92.0

## 2022-01-10 ENCOUNTER — Emergency Department (HOSPITAL_BASED_OUTPATIENT_CLINIC_OR_DEPARTMENT_OTHER)
Admission: EM | Admit: 2022-01-10 | Discharge: 2022-01-10 | Disposition: A | Discharge status: Home / Self Care | Payer: 59 | Attending: Student | Admitting: Student

## 2022-01-10 ENCOUNTER — Encounter (HOSPITAL_BASED_OUTPATIENT_CLINIC_OR_DEPARTMENT_OTHER): Payer: Self-pay | Admitting: Emergency Medicine

## 2022-01-10 ENCOUNTER — Emergency Department (HOSPITAL_BASED_OUTPATIENT_CLINIC_OR_DEPARTMENT_OTHER): Payer: 59

## 2022-01-10 ENCOUNTER — Other Ambulatory Visit: Payer: Self-pay

## 2022-01-10 DIAGNOSIS — K219 Gastro-esophageal reflux disease without esophagitis: Secondary | ICD-10-CM | POA: Diagnosis not present

## 2022-01-10 DIAGNOSIS — Z8616 Personal history of COVID-19: Secondary | ICD-10-CM | POA: Diagnosis not present

## 2022-01-10 DIAGNOSIS — N938 Other specified abnormal uterine and vaginal bleeding: Secondary | ICD-10-CM | POA: Insufficient documentation

## 2022-01-10 DIAGNOSIS — R9389 Abnormal findings on diagnostic imaging of other specified body structures: Secondary | ICD-10-CM

## 2022-01-10 DIAGNOSIS — R1032 Left lower quadrant pain: Secondary | ICD-10-CM | POA: Diagnosis present

## 2022-01-10 DIAGNOSIS — D259 Leiomyoma of uterus, unspecified: Secondary | ICD-10-CM | POA: Diagnosis not present

## 2022-01-10 LAB — URINALYSIS, ROUTINE W REFLEX MICROSCOPIC
Bilirubin Urine: NEGATIVE
Glucose, UA: NEGATIVE mg/dL
Ketones, ur: NEGATIVE mg/dL
Leukocytes,Ua: NEGATIVE
Nitrite: NEGATIVE
Protein, ur: NEGATIVE mg/dL
Specific Gravity, Urine: 1.007 (ref 1.005–1.030)
pH: 5.5 (ref 5.0–8.0)

## 2022-01-10 LAB — COMPREHENSIVE METABOLIC PANEL
ALT: 12 U/L (ref 0–44)
AST: 13 U/L — ABNORMAL LOW (ref 15–41)
Albumin: 4.3 g/dL (ref 3.5–5.0)
Alkaline Phosphatase: 61 U/L (ref 38–126)
Anion gap: 8 (ref 5–15)
BUN: 14 mg/dL (ref 6–20)
CO2: 26 mmol/L (ref 22–32)
Calcium: 9.6 mg/dL (ref 8.9–10.3)
Chloride: 105 mmol/L (ref 98–111)
Creatinine, Ser: 0.83 mg/dL (ref 0.44–1.00)
GFR, Estimated: >60 mL/min (ref >60)
Glucose, Bld: 88 mg/dL (ref 70–99)
Potassium: 4.1 mmol/L (ref 3.5–5.1)
Sodium: 139 mmol/L (ref 135–145)
Total Bilirubin: 0.8 mg/dL (ref 0.3–1.2)
Total Protein: 7.1 g/dL (ref 6.5–8.1)

## 2022-01-10 LAB — CBC WITH DIFFERENTIAL/PLATELET
Abs Immature Granulocytes: 0.01 10*3/uL (ref 0.00–0.07)
Basophils Absolute: 0.1 10*3/uL (ref 0.0–0.1)
Basophils Relative: 1 %
Eosinophils Absolute: 0.4 10*3/uL (ref 0.0–0.5)
Eosinophils Relative: 8 %
HCT: 35.6 % — ABNORMAL LOW (ref 36.0–46.0)
Hemoglobin: 11.3 g/dL — ABNORMAL LOW (ref 12.0–15.0)
Immature Granulocytes: 0 %
Lymphocytes Relative: 35 %
Lymphs Abs: 1.8 10*3/uL (ref 0.7–4.0)
MCH: 27.6 pg (ref 26.0–34.0)
MCHC: 31.7 g/dL (ref 30.0–36.0)
MCV: 86.8 fL (ref 80.0–100.0)
Monocytes Absolute: 0.4 10*3/uL (ref 0.1–1.0)
Monocytes Relative: 7 %
Neutro Abs: 2.5 10*3/uL (ref 1.7–7.7)
Neutrophils Relative %: 49 %
Platelets: 271 10*3/uL (ref 150–400)
RBC: 4.1 MIL/uL (ref 3.87–5.11)
RDW: 14.6 % (ref 11.5–15.5)
WBC: 5.1 10*3/uL (ref 4.0–10.5)
nRBC: 0 % (ref 0.0–0.2)

## 2022-01-10 LAB — PREGNANCY, URINE: Preg Test, Ur: NEGATIVE

## 2022-01-10 LAB — LIPASE, BLOOD: Lipase: 53 U/L — ABNORMAL HIGH (ref 11–51)

## 2022-01-10 MED ORDER — MEGESTROL ACETATE 40 MG PO TABS: 80.0000 mg | ORAL_TABLET | Freq: Every day | ORAL | 0 refills | Status: DC

## 2022-01-10 NOTE — ED Provider Notes (Signed)
MEDC[facility] EMERGENCY DEPT Provider Note  CSN: [id number] Arrival date & time: 01/10/22 1424  Chief Complaint(s) Abdominal Pain  HPI Monica Bennett is a 51 y.o. female with PMH menorrhagia secondary to uterine fibroid status post ablation who presents emergency department for evaluation of abdominal pain.  Patient states that 3 days ago she was lifting heavy object and had upper abdominal cramping and vaginal bleeding of which she saw an urgent care who ultimately diagnosed her with a muscle strain and sent her home.  Patient had return of symptoms last night with severe left lower quadrant abdominal pain and associated vaginal bleeding prompting presentation to the emergency department today.  She states that today, the amount of bleeding has decreased significantly but given her need for an ablation previously, she is concerned about the new abdominal pain.  Denies nausea, vomiting, headache, fever or other systemic symptoms.  Past Medical History Past Medical History:  Diagnosis Date   Chronic cough    sees dr wert for   COVID 08/07/2020   rapid at home test sore lymph node congestion and cough x 2 days all symptoms resolved   GERD (gastroesophageal reflux disease)    Menorrhagia    Migraines    Patient Active Problem List   Diagnosis Date Noted   Healthcare maintenance 06/11/2020   Upper airway cough syndrome 05/27/2020   GERD (gastroesophageal reflux disease) 05/30/2015   Migraine 08/24/2011   Home Medication(s) Prior to Admission medications   Medication Sig Start Date End Date Taking? Authorizing Provider  megestrol (MEGACE) 40 MG tablet Take 2 tablets (80 mg total) by mouth daily. 01/10/22  Yes Burnell Matlin, MD  Ascorbic Acid (VITAMIN C) 1000 MG tablet Take 1,000 mg by mouth daily.    [provider]  diphenhydramine-acetaminophen (TYLENOL PM) 25-500 MG TABS tablet Take 1 tablet by mouth at bedtime as needed.    [provider]  famotidine  (PEPCID) 20 MG tablet One after supper 08/27/20   Nyoka Cowden, MD  gabapentin (NEURONTIN) 300 MG capsule Take 1 capsule (300 mg total) by mouth 3 (three) times daily. 10/14/20   Nyoka Cowden, MD  ibuprofen (ADVIL) 200 MG tablet Take 200 mg by mouth every 6 (six) hours as needed. Takes 2 of 200 mg prn    [provider]  pantoprazole (PROTONIX) 40 MG tablet TAKE 1 TABLET (40 MG TOTAL) BY MOUTH DAILY. TAKE 30-60 MIN BEFORE FIRST MEAL OF THE DAY 08/20/20   Nyoka Cowden, MD  predniSONE (DELTASONE) 10 MG tablet Take  4 each am x 2 days,   2 each am x 2 days,  1 each am x 2 days and stop 10/14/20   Nyoka Cowden, MD                                                                                                                                    Past Surgical History  Past Surgical History:  Procedure Laterality Date   colonscopy  last done 2017   DILITATION & CURRETTAGE/HYSTROSCOPY WITH NOVASURE ABLATION N/A 09/22/2020   Procedure: DILATATION & CURETTAGE/HYSTEROSCOPY WITH NOVASURE ABLATION and myosure;  Surgeon: Olivia Mackie, MD;  Location: [facility];  Service: Gynecology;  Laterality: N/A;   Family History Family History  Problem Relation Age of Onset   Breast cancer Maternal Grandmother    Cancer Maternal Grandmother    Heart disease Maternal Grandfather    Cancer Paternal Grandmother    Cancer Paternal Grandfather     Social History Social History   Tobacco Use   Smoking status: Never   Smokeless tobacco: Never  Vaping Use   Vaping Use: Never used  Substance Use Topics   Alcohol use: No   Drug use: No   Allergies Patient has no known allergies.  Review of Systems Review of Systems  Gastrointestinal:  Positive for abdominal pain.  Genitourinary:  Positive for vaginal bleeding.    Physical Exam Vital Signs  I have reviewed the triage vital signs BP 122/90 (BP Location: Right Arm)   Pulse 60   Temp 98.1 F (36.7 C) (Oral)   Resp 16   SpO2  98%   Physical Exam Vitals and nursing note reviewed.  Constitutional:      General: She is not in acute distress.    Appearance: She is well-developed.  HENT:     Head: Normocephalic and atraumatic.  Eyes:     Conjunctiva/sclera: Conjunctivae normal.  Cardiovascular:     Rate and Rhythm: Normal rate and regular rhythm.     Heart sounds: No murmur heard. Pulmonary:     Effort: Pulmonary effort is normal. No respiratory distress.     Breath sounds: Normal breath sounds.  Abdominal:     Palpations: Abdomen is soft.     Tenderness: There is abdominal tenderness in the suprapubic area.  Musculoskeletal:        General: No swelling.     Cervical back: Neck supple.  Skin:    General: Skin is warm and dry.     Capillary Refill: Capillary refill takes less than 2 seconds.  Neurological:     Mental Status: She is alert.  Psychiatric:        Mood and Affect: Mood normal.     ED Results and Treatments Labs (all labs ordered are listed, but only abnormal results are displayed) Labs Reviewed  CBC WITH DIFFERENTIAL/PLATELET - Abnormal; Notable for the following components:      Result Value   Hemoglobin 11.3 (*)    HCT 35.6 (*)    All other components within normal limits  COMPREHENSIVE METABOLIC PANEL - Abnormal; Notable for the following components:   AST 13 (*)    All other components within normal limits  LIPASE, BLOOD - Abnormal; Notable for the following components:   Lipase 53 (*)    All other components within normal limits  URINALYSIS, ROUTINE W REFLEX MICROSCOPIC - Abnormal; Notable for the following components:   Color, Urine COLORLESS (*)    Hgb urine dipstick SMALL (*)    All other components within normal limits  PREGNANCY, URINE  Radiology US PELVIC COMPLETE WITH TRANSVAGINAL  Result Date: 01/10/2022 CLINICAL DATA:  Vaginal bleeding and  pelvic pain for 2-3 days, history of endometrial ablation for dysfunctional uterine bleeding EXAM: TRANSABDOMINAL AND TRANSVAGINAL ULTRASOUND OF PELVIS DOPPLER ULTRASOUND OF OVARIES TECHNIQUE: Both transabdominal and transvaginal ultrasound examinations of the pelvis were performed. Transabdominal technique was performed for global imaging of the pelvis including uterus, ovaries, adnexal regions, and pelvic cul-de-sac. It was necessary to proceed with endovaginal exam following the transabdominal exam to visualize the endometrium and ovaries. Color and duplex Doppler ultrasound was utilized to evaluate blood flow to the ovaries. COMPARISON:  None Available. FINDINGS: Uterus Measurements: 13.9 x 9.0 x 10.7 cm = volume: 657 mL. Large, heterogeneous fibroid enlarging the uterus, a subserosal fibroid arising from the anterior uterine fundus measuring 9.2 cm. Endometrium Thickness: 0.9 cm.  No focal abnormality visualized. Right ovary Not visualized. Left ovary Not visualized. Pulsed Doppler evaluation of both ovaries demonstrates normal low-resistance arterial and venous waveforms. Other findings No abnormal free fluid. IMPRESSION: 1. Abnormal thickening of the endometrium following ablation, measuring up to 0.9 cm. Consider tissue sampling to evaluate for neoplasm in the setting of recurrent uterine bleeding. 2. Large, heterogeneous fibroid enlarging the uterus, a subserosal fibroid arising from the anterior uterine fundus measuring 9.2 cm. 3. Nonvisualization of the bilateral ovaries. Electronically Signed   By: Jearld Lesch M.D.   On: 01/10/2022 17:50    Pertinent labs & imaging results that were available during my care of the patient were reviewed by me and considered in my medical decision making (see MDM for details).  Medications Ordered in ED Medications - No data to display                                                                                                                                    Procedures Procedures  (including critical care time)  Medical Decision Making / ED Course   This patient presents to the ED for concern of abdominal pain, vaginal bleeding, this involves an extensive number of treatment options, and is a complaint that carries with it a high risk of complications and morbidity.  The differential diagnosis includes uterine fibroid, ablation complication, thickened endometrium, ovarian cyst, diverticulitis, pancreatitis, cholecystitis  MDM: Patient seen emergency department for evaluation of abdominal pain and vaginal bleeding.  Physical exam with suprapubic tenderness to palpation but is otherwise unremarkable.  No guarding on abdominal exam.  Laboratory evaluation with hemoglobin 11.3 which is stable from patient's baseline, lipase minimally elevated 53, pregnancy test negative, urinalysis unremarkable.  Pelvic ultrasound obtained that was unable to visualize the ovaries, but shows an enlarged uterus with a thickened endometrium as well as a 9.2 cm subserosal uterine fibroid.  These findings certainly would explain the patient's pain and abnormal vaginal bleeding and I consulted the gynecologist on-call who recommended Megace if patient has persistent vaginal bleeding and outpatient follow-up for discussion on myomectomy  and likely endometrial biopsy.  The patient is requesting to not follow with the gynecologist who performed her ablation Dr. Billy Coast and would like a new OB/GYN and thus she was given resources to follow-up with a new OB/GYN.  I did place a courtesy page to Dr. Billy Coast to explain the findings today.  Patient then discharged with a prescription for Megace for the patient to fill as needed if she has persistent vaginal bleeding and outpatient follow-up with OB/GYN.  Of note, because we were unable to visualize the ovaries, I did offer the patient CT imaging to better visualize this, but after shared decision making, we both agreed that her symptoms can  be explained by the current ultrasound findings and given that she currently does not have any abdominal pain or vaginal bleeding there is lower suspicion for ovarian torsion at this time or intra-abdominal infection.  Thus, we agreed not to pursue the CT imaging.   Additional history obtained: -Additional history obtained from husband -External records from outside source obtained and reviewed including: Chart review including previous notes, labs, imaging, consultation notes   Lab Tests: -I ordered, reviewed, and interpreted labs.   The pertinent results include:   Labs Reviewed  CBC WITH DIFFERENTIAL/PLATELET - Abnormal; Notable for the following components:      Result Value   Hemoglobin 11.3 (*)    HCT 35.6 (*)    All other components within normal limits  COMPREHENSIVE METABOLIC PANEL - Abnormal; Notable for the following components:   AST 13 (*)    All other components within normal limits  LIPASE, BLOOD - Abnormal; Notable for the following components:   Lipase 53 (*)    All other components within normal limits  URINALYSIS, ROUTINE W REFLEX MICROSCOPIC - Abnormal; Notable for the following components:   Color, Urine COLORLESS (*)    Hgb urine dipstick SMALL (*)    All other components within normal limits  PREGNANCY, URINE      Imaging Studies ordered: I ordered imaging studies including TVUS I independently visualized and interpreted imaging. I agree with the radiologist interpretation   Medicines ordered and prescription drug management: Meds ordered this encounter  Medications   megestrol (MEGACE) 40 MG tablet    Sig: Take 2 tablets (80 mg total) by mouth daily.    Dispense:  30 tablet    Refill:  0    -I have reviewed the patients home medicines and have made adjustments as needed  Critical interventions none  Consultations Obtained: I requested consultation with the OB/GYN on-call Dr. Macon Large,  and discussed lab and imaging findings as well as  pertinent plan - they recommend: Discharge with outpatient follow-up, Megace as needed   Cardiac Monitoring: The patient was maintained on a cardiac monitor.  I personally viewed and interpreted the cardiac monitored which showed an underlying rhythm of: NSR  Social Determinants of Health:  Factors impacting patients care include: none   Reevaluation: After the interventions noted above, I reevaluated the patient and found that they have :improved  Co morbidities that complicate the patient evaluation  Past Medical History:  Diagnosis Date   Chronic cough    sees dr wert for   COVID 08/07/2020   rapid at home test sore lymph node congestion and cough x 2 days all symptoms resolved   GERD (gastroesophageal reflux disease)    Menorrhagia    Migraines       Dispostion: I considered admission for this patient, but she does not meet  inpatient criteria for admission is safe for discharge with outpatient OB/GYN follow-up with return precautions which she voiced understanding.     Final Clinical Impression(s) / ED Diagnoses Final diagnoses:  Endometrial thickening on ultrasound  Uterine leiomyoma, unspecified location     @PCDICTATION @    Glendora Score, MD 01/10/22 2252

## 2022-01-10 NOTE — ED Notes (Signed)
Patient transported to Ultrasound 

## 2022-01-16 DIAGNOSIS — M5430 Sciatica, unspecified side: Secondary | ICD-10-CM

## 2022-01-16 HISTORY — DX: Sciatica, unspecified side: M54.30

## 2022-04-17 IMAGING — MG MM DIGITAL DIAGNOSTIC UNILAT*L* W/ TOMO W/ CAD
6 series · 6 of 18 positions shown · non-contrast
Comparison: Previous exam(s).
COMPARISON: Previous exam(s).

Addendum:
CLINICAL DATA: The patient was called back for a possible
mass/asymmetry in the right breast.

EXAM:
DIGITAL DIAGNOSTIC UNILATERAL LEFT MAMMOGRAM WITH TOMOSYNTHESIS AND
CAD
TECHNIQUE: Left digital diagnostic mammography and breast tomosynthesis was
performed. The images were evaluated with computer-aided detection.

[L ML synth-2D]
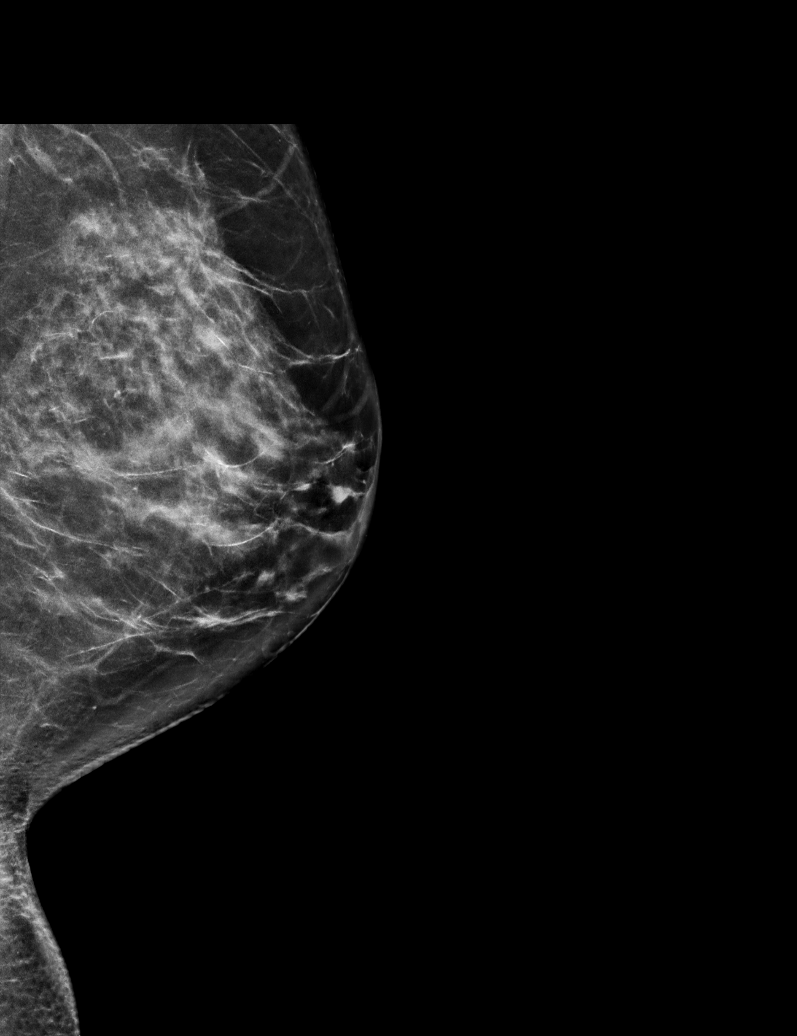

[L MLO synth-2D (1 of 2)]
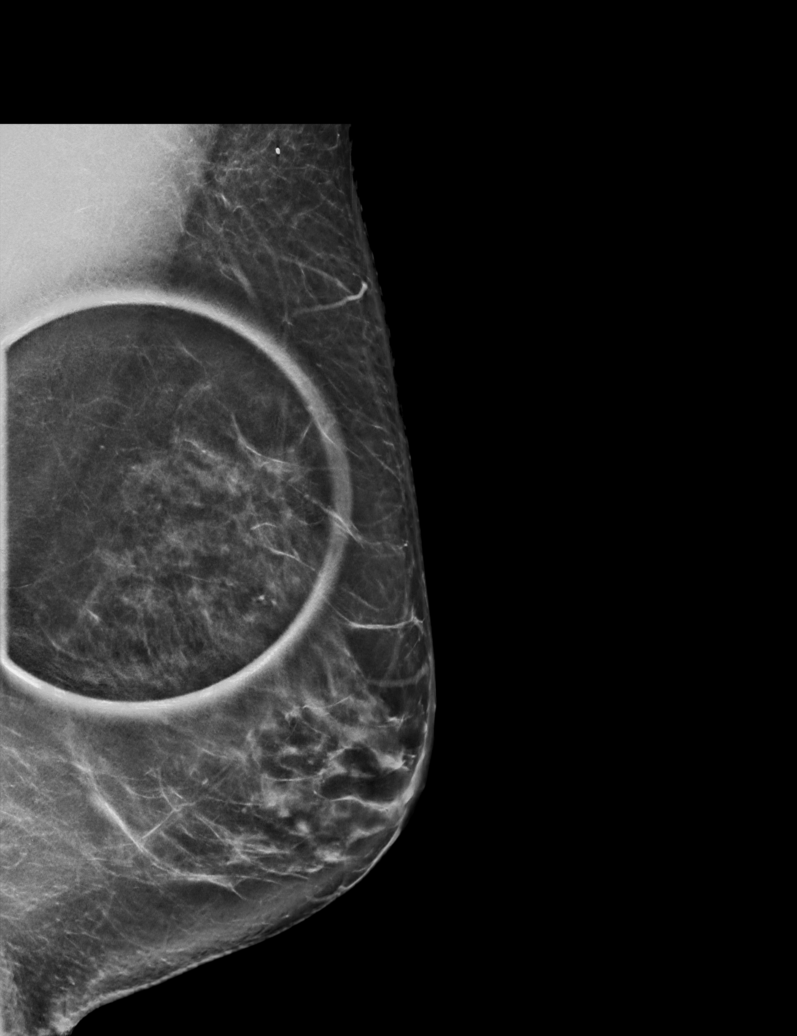

[L MLO synth-2D (2 of 2)]
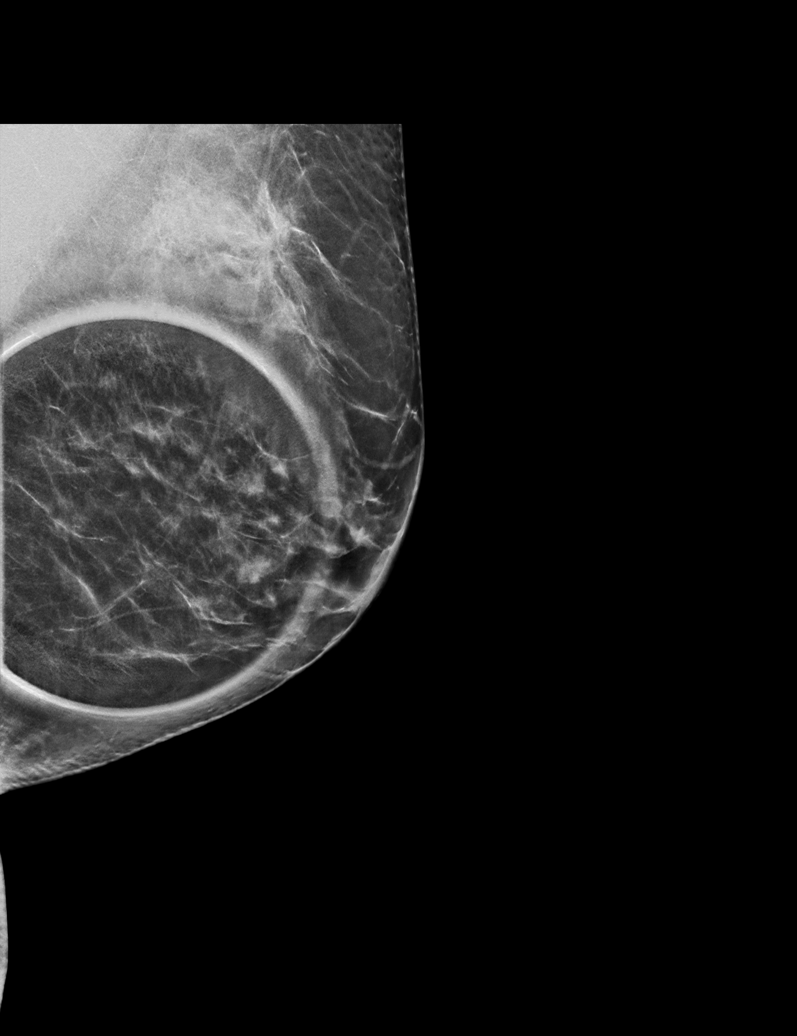

[L MLO tomo (1 of 2) · tomo slice 36/71.0]
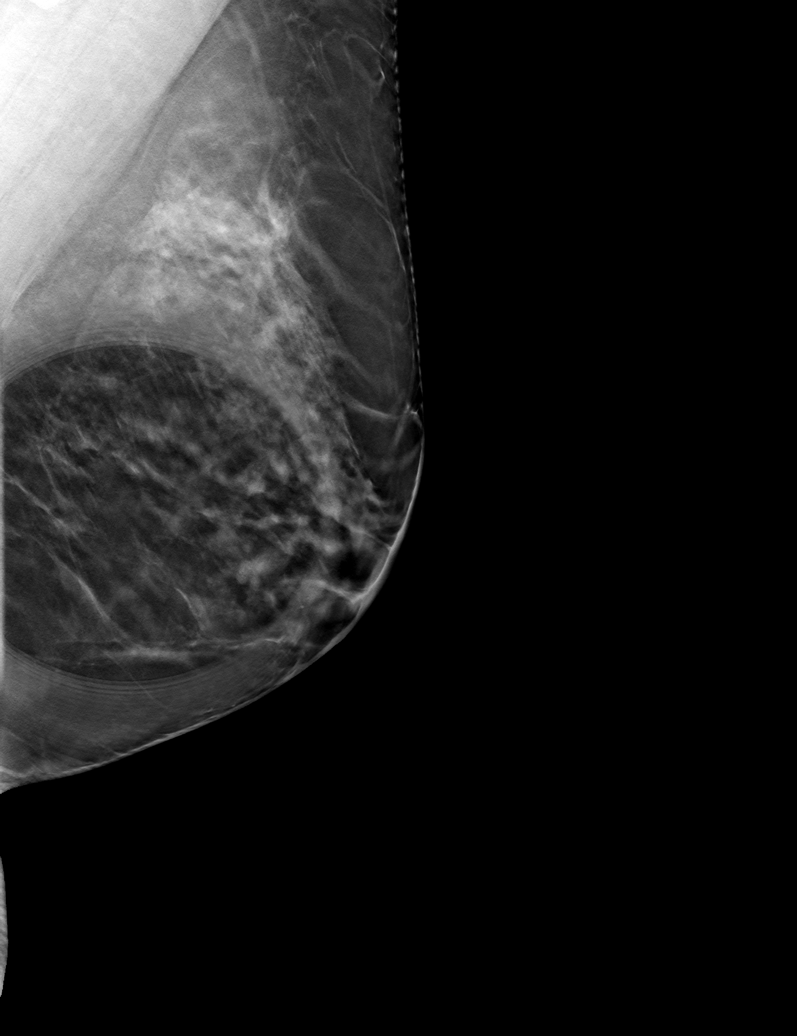

[L MLO tomo (2 of 2) · tomo slice 43/84.0]
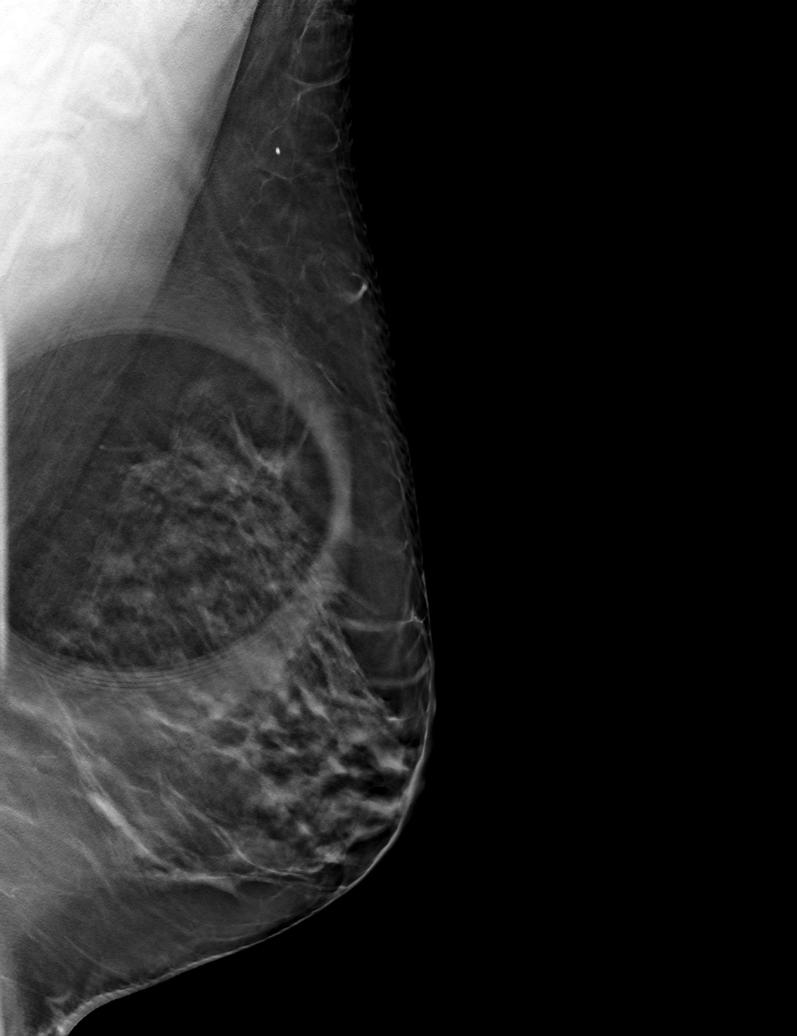

[L ML tomo · tomo slice 39/76.0]
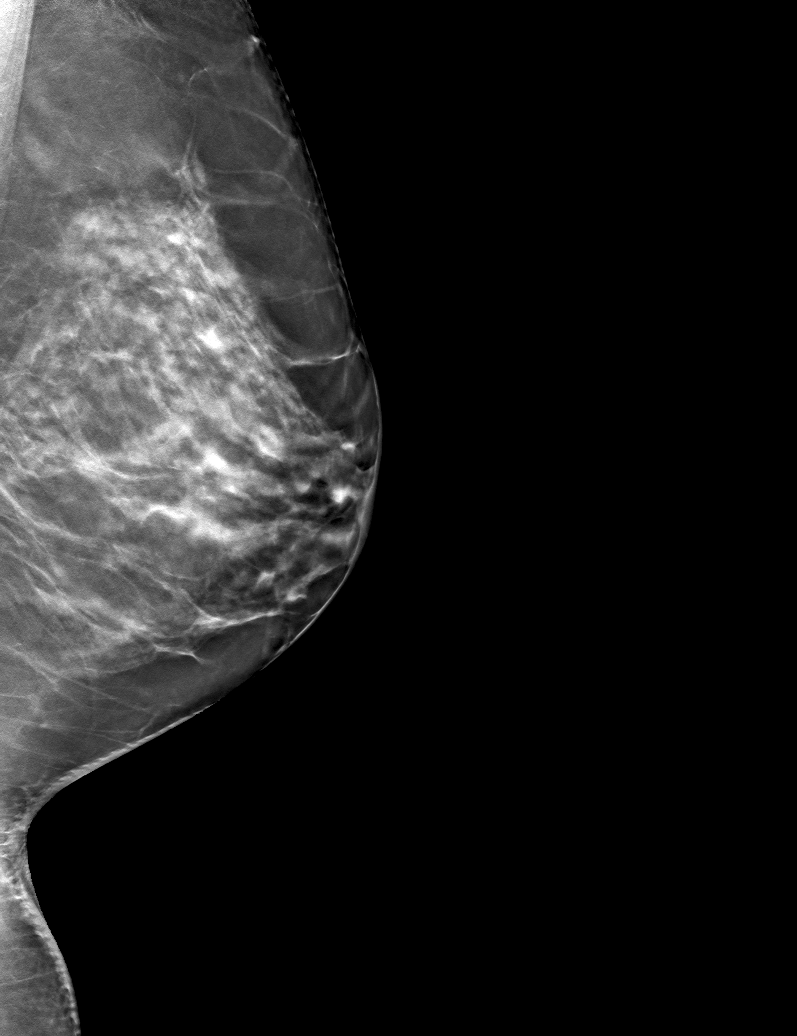

[6 of 18 positions shown; findings below may reference images not displayed]

ACR Breast Density Category c: The breast tissue is heterogeneously
dense, which may obscure small masses.
FINDINGS: The possible mass/asymmetry resolves on today's imaging.
IMPRESSION: Graphic evidence of malignancy.

RECOMMENDATION:
Annual screening mammography.

I have discussed the findings and recommendations with the patient.
If applicable, a reminder letter will be sent to the patient
regarding the next appointment.

BI-RADS CATEGORY  1: Negative.

ADDENDUM:
The patient was called back for a left breast mass/asymmetry, not a
right breast mass/asymmetry.

*** End of Addendum ***
ACR Breast Density Category c: The breast tissue is heterogeneously
dense, which may obscure small masses.
FINDINGS: The possible mass/asymmetry resolves on today's imaging.
IMPRESSION: Graphic evidence of malignancy.

RECOMMENDATION:
Annual screening mammography.

I have discussed the findings and recommendations with the patient.
If applicable, a reminder letter will be sent to the patient
regarding the next appointment.

BI-RADS CATEGORY  1: Negative.

## 2022-06-21 ENCOUNTER — Encounter (HOSPITAL_BASED_OUTPATIENT_CLINIC_OR_DEPARTMENT_OTHER): Payer: Self-pay | Admitting: Obstetrics and Gynecology

## 2022-06-22 ENCOUNTER — Other Ambulatory Visit: Payer: Self-pay

## 2022-06-22 ENCOUNTER — Encounter (HOSPITAL_BASED_OUTPATIENT_CLINIC_OR_DEPARTMENT_OTHER): Payer: Self-pay | Admitting: Obstetrics and Gynecology

## 2022-06-22 DIAGNOSIS — Z01818 Encounter for other preprocedural examination: Secondary | ICD-10-CM | POA: Diagnosis not present

## 2022-06-22 NOTE — Progress Notes (Signed)
Your procedure is scheduled on Wednesday, 06/30/22.  Report to [place] [facility].   Call this number if you have problems the morning of surgery  :[phone].   OUR ADDRESS IS [address].  WE ARE LOCATED IN [facility].  PLEASE BRING YOUR INSURANCE CARD AND PHOTO ID DAY OF SURGERY.  ONLY 2 PEOPLE ARE ALLOWED IN  WAITING  ROOM.                                      REMEMBER:  DO NOT EAT FOOD, CANDY GUM OR MINTS  AFTER MIDNIGHT THE NIGHT BEFORE YOUR SURGERY . YOU MAY HAVE CLEAR LIQUIDS FROM MIDNIGHT THE NIGHT BEFORE YOUR SURGERY UNTIL 9:00 AM NO CLEAR LIQUIDS AFTER  9:00 AM DAY OF SURGERY.  YOU MAY  BRUSH YOUR TEETH MORNING OF SURGERY AND RINSE YOUR MOUTH OUT, NO CHEWING GUM CANDY OR MINTS.     CLEAR LIQUID DIET   Foods Allowed                                                                     Foods Excluded  Coffee and tea, regular and decaf                             liquids that you cannot  Plain Jell-O                                                                   see through such as: Fruit ices (not with fruit pulp)                                     milk, soups, orange juice  Plain  Popsicles                                    All solid food Carbonated beverages, regular and diet                                    Cranberry, grape and apple juices Sports drinks like Gatorade _____________________________________________________________________     TAKE ONLY THESE MEDICATIONS MORNING OF SURGERY:  NONE    UP TO 4 VISITORS  MAY VISIT IN THE EXTENDED RECOVERY ROOM UNTIL 800 PM ONLY.  ONE  VISITOR AGE 5 AND OVER MAY SPEND THE NIGHT AND MUST BE IN EXTENDED RECOVERY ROOM NO LATER THAN 800 PM . YOUR DISCHARGE TIME AFTER YOU SPEND THE NIGHT IS 900 AM THE MORNING AFTER YOUR SURGERY.  YOU MAY PACK A SMALL OVERNIGHT BAG WITH TOILETRIES FOR YOUR OVERNIGHT STAY IF  YOU WISH.  YOUR PRESCRIPTION MEDICATIONS WILL BE PROVIDED  DURING [place].                                      DO NOT WEAR JEWERLY, MAKE UP. DO NOT WEAR LOTIONS, POWDERS, PERFUMES OR NAIL POLISH ON YOUR FINGERNAILS. TOENAIL POLISH IS OK TO WEAR. DO NOT SHAVE FOR 48 HOURS PRIOR TO DAY OF SURGERY. MEN MAY SHAVE FACE AND NECK. CONTACTS, GLASSES, OR DENTURES MAY NOT BE WORN TO SURGERY.  REMEMBER: NO SMOKING, DRUGS OR ALCOHOL FOR 24 HOURS BEFORE YOUR SURGERY.                                    [facility] IS NOT RESPONSIBLE  FOR ANY BELONGINGS.                                                                    Marland Kitchen           [place] - Preparing for Surgery Before surgery, you can play an important role.  Because skin is not sterile, your skin needs to be as free of germs as possible.  You can reduce the number of germs on your skin by washing with CHG (chlorahexidine gluconate) soap before surgery.  CHG is an antiseptic cleaner which kills germs and bonds with the skin to continue killing germs even after washing. Please DO NOT use if you have an allergy to CHG or antibacterial soaps.  If your skin becomes reddened/irritated stop using the CHG and inform your nurse when you arrive at Short Stay. Do not shave (including legs and underarms) for at least 48 hours prior to the first CHG shower.  You may shave your face/neck. Please follow these instructions carefully:  1.  Shower with CHG Soap the night before surgery and the  morning of Surgery.  2.  If you choose to wash your hair, wash your hair first as usual with your  normal  shampoo.  3.  After you shampoo, rinse your hair and body thoroughly to remove the  shampoo.                                        4.  Use CHG as you would any other liquid soap.  You can apply chg directly  to the skin and wash , chg soap provided, night before and morning of your surgery.  5.  Apply the CHG Soap to your body ONLY FROM THE NECK DOWN.   Do not use on face/ open                           Wound or open  sores. Avoid contact with eyes, ears mouth and genitals (private parts).                       Wash face,  Genitals (private parts) with your normal soap.  6.  Wash thoroughly, paying special attention to the area where your surgery  will be performed.  7.  Thoroughly rinse your body with warm water from the neck down.  8.  DO NOT shower/wash with your normal soap after using and rinsing off  the CHG Soap.             9.  Pat yourself dry with a clean towel.            10.  Wear clean pajamas.            11.  Place clean sheets on your bed the night of your first shower and do not  sleep with pets. Day of Surgery : Do not apply any lotions/deodorants the morning of surgery.  Please wear clean clothes to the hospital/surgery center.  IF YOU HAVE ANY SKIN IRRITATION OR PROBLEMS WITH THE SURGICAL SOAP, PLEASE GET A BAR OF GOLD DIAL SOAP AND SHOWER THE NIGHT BEFORE YOUR SURGERY AND THE MORNING OF YOUR SURGERY. PLEASE LET THE NURSE KNOW MORNING OF YOUR SURGERY IF YOU HAD ANY PROBLEMS WITH THE SURGICAL SOAP.   ________________________________________________________________________                                                        QUESTIONS [facility] PRE OP NURSE PHONE [phone].

## 2022-06-22 NOTE — Progress Notes (Addendum)
Spoke w/ via phone for pre-op interview---Monica Bennett needs dos----  urine pregnancy per anesthesia, surgeon orders pending as of 06/22/22            Bennett results------12/11 Bennett appt for cbc, type & screen COVID test -----Patient has congestion and runny nose as of 06/22/22. Patient states her son had a cold last week. I instructed patient to call her surgeon and let them know her symptoms. I advised her to take a home Covid test. Patient was agreeable. Arrive at -------1000 on Wednesday, 06/30/22 NPO after MN NO Solid Food.  Clear liquids from MN until---0900 Med rec completed Medications to take morning of surgery -----none Diabetic medication -----n/a Patient instructed no nail polish to be worn day of surgery Patient instructed to bring photo id and insurance card day of surgery Patient aware to have Driver (ride ) / caregiver    for 24 hours after surgery  Patient Special Instructions -----Extended / overnight instructions given.  Pre-Op special Istructions -----Requested orders from Dr. Landry Mellow via Epic IB on 06/22/22. Patient verbalized understanding of instructions that were given at this phone interview. Patient denies shortness of breath, chest pain, fever, cough at this phone interview.

## 2022-06-28 ENCOUNTER — Encounter (HOSPITAL_COMMUNITY)
Admission: RE | Admit: 2022-06-28 | Discharge: 2022-06-28 | Disposition: A | Discharge status: Home / Self Care | Payer: 59 | Source: Ambulatory Visit | Attending: Obstetrics and Gynecology | Admitting: Obstetrics and Gynecology

## 2022-06-28 DIAGNOSIS — Z01818 Encounter for other preprocedural examination: Secondary | ICD-10-CM | POA: Insufficient documentation

## 2022-06-28 LAB — CBC
HCT: 39.8 % (ref 36.0–46.0)
Hemoglobin: 12.9 g/dL (ref 12.0–15.0)
MCH: 28.2 pg (ref 26.0–34.0)
MCHC: 32.4 g/dL (ref 30.0–36.0)
MCV: 87.1 fL (ref 80.0–100.0)
Platelets: UNDETERMINED 10*3/uL (ref 150–400)
RBC: 4.57 MIL/uL (ref 3.87–5.11)
RDW: 15.1 % (ref 11.5–15.5)
WBC: 4.4 10*3/uL (ref 4.0–10.5)
nRBC: 0 % (ref 0.0–0.2)

## 2022-06-29 ENCOUNTER — Other Ambulatory Visit: Payer: Self-pay | Admitting: Obstetrics and Gynecology

## 2022-06-29 DIAGNOSIS — N939 Abnormal uterine and vaginal bleeding, unspecified: Secondary | ICD-10-CM

## 2022-06-29 NOTE — H&P (Signed)
Subjective:    Chief Complaint(s):    Abnormal uterine bleeding and fibroids. / Preop    HPI:      General          51 y/o presents for preop visit. Pt is schedule for a robotic assisted laparoscopic hysterectomy with bilateral salpingectomy on 06/30/2022 for the management of AUB and fibroids.            HISTORY:            She reports recieving a Covid booster 06/2020. after the booster she started having irregular bleeding. She had clots the size of her hand. she would change a large pad every 30 minutes. This would last for an hour and then she would change every couple of hours. This started 06/2020 and she bled daily for 3 months. she was started on medication by Dr. Ellwood Sayers office. In June 2023 she began having extreme pain and started bleeding profusely.             Pt notes history of ablation in 08-2020. Pt reports that menorrhagia was initially improved with ablation x 5 weeks, then vaginal bleeding resumed to spotting to moderate flow daily.            Denies vaginal discharge, changes in bowel movements or urinary symptoms.            ER Pelvic ultrasound 01/10/2022 impression; Uterus measures 13.9 cm x 9.0 cm x 10.7 cm.            1. Abnormal thickening of the endometrium following ablation, measuring up to 0.9cm. Consider tissue sampling to evaluate for neoplasma in the setting of recurrent uterine bleeding.            2.Large heterogeneous fibroid enlarging the uterus, a subserosal fibroid arising from the anterior uterine fundus measuring 9.2 cm            3. Nonvisualization of the bilateral ovaries.            Pt reports history of menorrhagia & fibroid treated by Dr. Mardene Speak, [facility] in 2022. Initially had IUD placed for treatment, but IUD expelled and 07/27/20 ultrasound revealed 9 cm posterior fibroid.            08/14/20; LABS: [facility] 1.9/ AMH 0.027/ TSH 1.51            08/28/20: Pt had hysteroscopy, D&C and ablation.            09/22/20: Endometrium polpectomy pathology  revealed prolifertive endometrium, benign endometrial polyp and benign cervical mucosa, no hyperplasa or carcinoma.            she had an endometrial biopsy performed 01/21/2022 that was benign.     Current Medication:     Discontinued    Provera(medroxyPROGESTERone Acetate) 10 MG Tablet 1 tablet with food Orally Once a day.   Medication List reviewed and reconciled with the patient.    Medical History:    Medical History Verified.    Allergies/Intolerance:      N.K.D.A.    Gyn History:    Sexual activity currently sexually active.  Periods : regular.  LMP 12/2021-04/2022.  Denies Birth control.  Last pap smear date 2022 - negative.  Last mammogram date 04/2021 - Negative.  Denies Abnormal pap smear.  Denies STD.     OB History:    Number of pregnancies  2.  Pregnancy # 1  live birth, vaginal delivery, boy.  Pregnancy # 2  live birth, vaginal delivery, boys, twins.     Surgical History:    ablation 09/2020    right shoulder repair 2022    Hospitalization:    No Hospitalization History.    Family History:   Father: alive, hypercholesterolemia   Mother: alive   Maternal Grand Mother: deceased, diagnosed with Breast cancer   1 brother(s) - healthy. 3 son(s) - healthy.    Social History:      General         Tobacco use cigarettes:  Never smoked, Tobacco history last updated  06/16/2022, Vaping  No.           Alcohol: yes, social.          Caffeine: yes, coffee, 2+ servings daily.          Recreational drug use: no.          Children: 3, son (s).          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    ROS:      CONSTITUTIONAL         Chills  No.  Fatigue  No.  Fever  No.  Night sweats  No.  Recent travel outside [place]  No.  Sweats  No.  Weight change  No.       OPHTHALMOLOGY         Blurring of vision  no.  Change in vision  no.  Double vision  no.       ENT         Dizziness  no.  Nose bleeds  no.  Sore throat  no.  Teeth pain  no.       ALLERGY         Hives   no.       CARDIOLOGY         Chest pain  no.  High blood pressure  no.  Irregular heart beat  no.  Leg edema  no.  Palpitations  no.       RESPIRATORY         Shortness of breath  no.  Cough  no.  Wheezing  no.       UROLOGY         Pain with urination  no.  Urinary urgency  no.  Urinary frequency  no.  Urinary incontinence  no.  Difficulty urinating  No.  Blood in urine  No.       GASTROENTEROLOGY         Abdominal pain  no.  Appetite change  no.  Bloating/belching  no.  Blood in stool or on toilet paper  no.  Change in bowel movements  no.  Constipation  no.  Diarrhea  no.  Difficulty swallowing  no.  Nausea  no.       FEMALE REPRODUCTIVE         Vulvar pain  no.  Vulvar rash  no.  Abnormal vaginal bleeding  , yes.  Breast pain  no.  Nipple discharge  no.  Pain with intercourse  no.  Pelvic pain  no.  Unusual vaginal discharge  no.  Vaginal itching  no.       MUSCULOSKELETAL         Muscle aches  no.       NEUROLOGY         Headache  no.  Tingling/numbness  no.  Weakness  no.       PSYCHOLOGY  Depression  no.  Anxiety  no.  Nervousness  no.  Sleep disturbances  no.  Suicidal ideation  no .       ENDOCRINOLOGY         Excessive thirst  no.  Excessive urination  no.  Hair loss  no.  Heat or cold intolerance  no.       HEMATOLOGY/LYMPH         Abnormal bleeding  no.  Easy bruising  no.  Swollen glands  no.       DERMATOLOGY         New/changing skin lesion  no.  Rash  no.  Sores  no.         Negative except as stated in HPI.  Objective:    Vitals:        Wt: 181.6, Wt change: -0.6 lbs, Ht: 66, BMI: 29.31, Pulse sitting: 76, BP sitting: 131/83.    Past Results:    Examination:      General Examination         CONSTITUTIONAL: alert, oriented, NAD.          SKIN: moist, warm.          EYES: Conjunctiva clear.          LUNGS: good I:E efffort noted.          ABDOMEN: soft, non-tender/non-distended, bowel sounds present.          FEMALE GENITOURINARY: normal external genitalia,  labia - unremarkable, vagina - pink moist mucosa, no lesions or abnormal discharge, cervix - no discharge or lesions or CMT, adnexa - no masses or tenderness, uterus - nontender and normal size on palpation.          PSYCH: affect normal, good eye contact.     Physical Examination:      Chaperone present          Chaperone present Antonieta Loveless 06/16/2022 04:14:24 PM >, for pelvic exam.    Assessment:    Assessment:    Abnormal uterine bleeding - N93.9 (Primary)    Uterine fibroid - D25.9   Plan:    Treatment:     Abnormal uterine bleeding          Notes: Pt is scheduled for robotic assisted laparoscopic hysterectomy w/ bilateral salpingectomy on 06/30/2022 secondary to uterine fibroid and abnormal uterine bleeding. . Pt is advised that in order to be discharged from hospital, she will need to be able to ambulate, urinate, tolerate food by mouth, and take pain medication by mouth. Discussed w/ pt risks of hysterectomy including but not limited to infection/bleeding, damage to bowel, bladder, ureters and surrounding organs, with the need for further surgery. Discussed risk of blood transfusion and risk of HIV or hep B&C with blood transfusion. Pt is aware of risks and desires blood transfusion if needed. Pt advised to avoid NSAIDs (Asprin, Aleve, Advil, Ibuprofen, Motrin, Excedrin) between now and surgery given risk of bleeding during surgery. She may take Tylenol for pain management. She is advised to avoid eating or drinking starting midnight prior to surgery. Discussed post-surgery avoidance of driving for 1 week and lifting weight greater than 10 lbs or intercourse for 6-8 weeks. Follow up 2 wks after surgery for post-op visit.     Uterine fibroid          Notes: Pt is scheduled for robotic assisted laparoscopic hysterectomy w/ bilateral salpingectomy on 06/30/2022 secondary to uterine fibroid and abnormal uterine bleeding. . Pt is advised  that in order to be discharged from hospital,  she will need to be able to ambulate, urinate, tolerate food by mouth, and take pain medication by mouth. Discussed w/ pt risks of hysterectomy including but not limited to infection/bleeding, damage to bowel, bladder, ureters and surrounding organs, with the need for further surgery. Discussed risk of blood transfusion and risk of HIV or hep B&C with blood transfusion. Pt is aware of risks and desires blood transfusion if needed. Pt advised to avoid NSAIDs (Asprin, Aleve, Advil, Ibuprofen, Motrin, Excedrin) between now and surgery given risk of bleeding during surgery. She may take Tylenol for pain management. She is advised to avoid eating or drinking starting midnight prior to surgery. Discussed post-surgery avoidance of driving for 1 week and lifting weight greater than 10 lbs or intercourse for 6-8 weeks. Follow up 2 wks after surgery for post-op visit.

## 2022-06-29 NOTE — H&P (Deleted)
  The note originally documented on this encounter has been moved the the encounter in which it belongs.  

## 2022-06-29 NOTE — Anesthesia Preprocedure Evaluation (Signed)
Anesthesia Evaluation  Patient identified by MRN, date of birth, ID band Patient awake    Reviewed: Allergy & Precautions, NPO status , Patient's Chart, lab work & pertinent test results  Airway Mallampati: II  TM Distance: >3 FB Neck ROM: Full    Dental no notable dental hx. (+) Teeth Intact, Dental Advisory Given   Pulmonary neg pulmonary ROS   Pulmonary exam normal breath sounds clear to auscultation       Cardiovascular Normal cardiovascular exam Rhythm:Regular Rate:Normal     Neuro/Psych  Headaches  Anxiety        GI/Hepatic Neg liver ROS,GERD  ,,  Endo/Other  negative endocrine ROS    Renal/GU negative Renal ROS     Musculoskeletal negative musculoskeletal ROS (+)    Abdominal   Peds  Hematology negative hematology ROS (+) Lab Results      Component                Value               Date                      WBC                      4.4                 06/28/2022                HGB                      12.9                06/28/2022                HCT                      39.8                06/28/2022                MCV                      87.1                06/28/2022                PLT                                          06/28/2022            PLATELET CLUMPS NOTED ON SMEAR, UNABLE TO ESTIMATE    Anesthesia Other Findings NKDA  Reproductive/Obstetrics negative OB ROS                             Anesthesia Physical Anesthesia Plan  ASA: 2  Anesthesia Plan: General   Post-op Pain Management: Lidocaine infusion*, Ketamine IV* and Ofirmev IV (intra-op)*   Induction: Intravenous  PONV Risk Score and Plan: 4 or greater and Treatment may vary due to age or medical condition, Midazolam, Dexamethasone and Ondansetron  Airway Management Planned: Oral ETT  Additional Equipment: None  Intra-op Plan:   Post-operative Plan: Extubation in OR  Informed Consent: I have  reviewed  the patients History and Physical, chart, labs and discussed the procedure including the risks, benefits and alternatives for the proposed anesthesia with the patient or authorized representative who has indicated his/her understanding and acceptance.     Dental advisory given  Plan Discussed with: CRNA, Anesthesiologist and Surgeon  Anesthesia Plan Comments:         Anesthesia Quick Evaluation

## 2022-06-30 ENCOUNTER — Observation Stay (HOSPITAL_BASED_OUTPATIENT_CLINIC_OR_DEPARTMENT_OTHER): Payer: 59 | Admitting: Certified Registered"

## 2022-06-30 ENCOUNTER — Observation Stay (HOSPITAL_BASED_OUTPATIENT_CLINIC_OR_DEPARTMENT_OTHER)
Admission: RE | Admit: 2022-06-30 | Discharge: 2022-07-01 | Disposition: A | Discharge status: Home / Self Care | Payer: 59 | Source: Ambulatory Visit | Attending: Obstetrics and Gynecology | Admitting: Obstetrics and Gynecology

## 2022-06-30 ENCOUNTER — Encounter (HOSPITAL_BASED_OUTPATIENT_CLINIC_OR_DEPARTMENT_OTHER)
Admission: RE | Disposition: A | Discharge status: Home / Self Care | Payer: Self-pay | Source: Ambulatory Visit | Attending: Obstetrics and Gynecology

## 2022-06-30 ENCOUNTER — Encounter (HOSPITAL_BASED_OUTPATIENT_CLINIC_OR_DEPARTMENT_OTHER): Payer: Self-pay | Admitting: Obstetrics and Gynecology

## 2022-06-30 ENCOUNTER — Other Ambulatory Visit: Payer: Self-pay

## 2022-06-30 DIAGNOSIS — D259 Leiomyoma of uterus, unspecified: Secondary | ICD-10-CM | POA: Insufficient documentation

## 2022-06-30 DIAGNOSIS — N939 Abnormal uterine and vaginal bleeding, unspecified: Principal | ICD-10-CM | POA: Insufficient documentation

## 2022-06-30 DIAGNOSIS — D219 Benign neoplasm of connective and other soft tissue, unspecified: Secondary | ICD-10-CM

## 2022-06-30 DIAGNOSIS — Z01818 Encounter for other preprocedural examination: Secondary | ICD-10-CM

## 2022-06-30 HISTORY — PX: ROBOTIC ASSISTED LAPAROSCOPIC HYSTERECTOMY AND SALPINGECTOMY: SHX6379

## 2022-06-30 HISTORY — DX: Anxiety disorder, unspecified: F41.9

## 2022-06-30 LAB — POCT PREGNANCY, URINE: Preg Test, Ur: NEGATIVE

## 2022-06-30 LAB — ABO/RH: ABO/RH(D): A POS

## 2022-06-30 LAB — TYPE AND SCREEN
ABO/RH(D): A POS
Antibody Screen: NEGATIVE

## 2022-06-30 SURGERY — XI ROBOTIC ASSISTED LAPAROSCOPIC HYSTERECTOMY AND SALPINGECTOMY
Anesthesia: General | Site: Abdomen | Laterality: Bilateral

## 2022-06-30 MED ORDER — SODIUM CHLORIDE 0.9 % IV SOLN
INTRAVENOUS | Status: DC | PRN
  Administered 2022-06-30: 10 mL
  Administered 2022-06-30: 100 mL

## 2022-06-30 MED ORDER — LACTATED RINGERS IV SOLN
INTRAVENOUS | Status: DC
  Administered 2022-06-30: 1 mL via INTRAVENOUS

## 2022-06-30 MED ORDER — ONDANSETRON HCL 4 MG PO TABS: 4.0000 mg | ORAL_TABLET | Freq: Four times a day (QID) | ORAL | Status: DC | PRN

## 2022-06-30 MED ORDER — ONDANSETRON HCL 4 MG/2ML IJ SOLN
INTRAMUSCULAR | Status: DC | PRN
  Administered 2022-06-30: 4 mg via INTRAVENOUS

## 2022-06-30 MED ORDER — LACTATED RINGERS IV SOLN: INTRAVENOUS | Status: DC

## 2022-06-30 MED ORDER — ONDANSETRON HCL 4 MG/2ML IJ SOLN
INTRAMUSCULAR | Status: AC
  Filled 2022-06-30: qty 2

## 2022-06-30 MED ORDER — OXYCODONE HCL 5 MG PO TABS
ORAL_TABLET | ORAL | Status: AC
  Filled 2022-06-30: qty 2

## 2022-06-30 MED ORDER — SODIUM CHLORIDE 0.9 % IV SOLN
INTRAVENOUS | Status: AC
  Filled 2022-06-30: qty 2

## 2022-06-30 MED ORDER — PHENYLEPHRINE 80 MCG/ML (10ML) SYRINGE FOR IV PUSH (FOR BLOOD PRESSURE SUPPORT)
PREFILLED_SYRINGE | INTRAVENOUS | Status: AC
  Filled 2022-06-30: qty 10

## 2022-06-30 MED ORDER — OXYCODONE HCL 5 MG PO TABS: 5.0000 mg | ORAL_TABLET | Freq: Once | ORAL | Status: DC | PRN

## 2022-06-30 MED ORDER — HYDROMORPHONE HCL 1 MG/ML IJ SOLN
INTRAMUSCULAR | Status: AC
  Filled 2022-06-30: qty 1

## 2022-06-30 MED ORDER — PANTOPRAZOLE SODIUM 40 MG PO TBEC
DELAYED_RELEASE_TABLET | ORAL | Status: AC
  Filled 2022-06-30: qty 1

## 2022-06-30 MED ORDER — ACETAMINOPHEN 500 MG PO TABS
ORAL_TABLET | ORAL | Status: AC
  Filled 2022-06-30: qty 2

## 2022-06-30 MED ORDER — WHITE PETROLATUM EX OINT
TOPICAL_OINTMENT | CUTANEOUS | Status: AC
  Filled 2022-06-30: qty 5

## 2022-06-30 MED ORDER — SODIUM CHLORIDE 0.9 % IR SOLN
Status: DC | PRN
  Administered 2022-06-30: 1000 mL

## 2022-06-30 MED ORDER — DEXAMETHASONE SODIUM PHOSPHATE 4 MG/ML IJ SOLN
INTRAMUSCULAR | Status: DC | PRN
  Administered 2022-06-30: 5 mg via INTRAVENOUS

## 2022-06-30 MED ORDER — HYDROMORPHONE HCL 1 MG/ML IJ SOLN
0.2000 mg | INTRAMUSCULAR | Status: DC | PRN
  Administered 2022-06-30: 0.5 mg via INTRAVENOUS

## 2022-06-30 MED ORDER — POVIDONE-IODINE 10 % EX SWAB: 2.0000 | Freq: Once | CUTANEOUS | Status: DC

## 2022-06-30 MED ORDER — FENTANYL CITRATE (PF) 100 MCG/2ML IJ SOLN
INTRAMUSCULAR | Status: DC | PRN
  Administered 2022-06-30 (×2): 50 ug via INTRAVENOUS
  Administered 2022-06-30: 100 ug via INTRAVENOUS
  Administered 2022-06-30 (×3): 50 ug via INTRAVENOUS

## 2022-06-30 MED ORDER — MIDAZOLAM HCL 5 MG/5ML IJ SOLN
INTRAMUSCULAR | Status: DC | PRN
  Administered 2022-06-30: 2 mg via INTRAVENOUS

## 2022-06-30 MED ORDER — ZOLPIDEM TARTRATE 5 MG PO TABS: 5.0000 mg | ORAL_TABLET | Freq: Every evening | ORAL | Status: DC | PRN

## 2022-06-30 MED ORDER — PANTOPRAZOLE SODIUM 40 MG PO TBEC
40.0000 mg | DELAYED_RELEASE_TABLET | Freq: Every day | ORAL | Status: DC
  Administered 2022-06-30: 40 mg via ORAL

## 2022-06-30 MED ORDER — MENTHOL 3 MG MT LOZG: 1.0000 | LOZENGE | OROMUCOSAL | Status: DC | PRN

## 2022-06-30 MED ORDER — PROPOFOL 10 MG/ML IV BOLUS
INTRAVENOUS | Status: DC | PRN
  Administered 2022-06-30: 40 mg via INTRAVENOUS
  Administered 2022-06-30: 160 mg via INTRAVENOUS

## 2022-06-30 MED ORDER — SUGAMMADEX SODIUM 200 MG/2ML IV SOLN
INTRAVENOUS | Status: DC | PRN
  Administered 2022-06-30: 350 mg via INTRAVENOUS

## 2022-06-30 MED ORDER — STERILE WATER FOR IRRIGATION IR SOLN
Status: DC | PRN
  Administered 2022-06-30: 500 mL

## 2022-06-30 MED ORDER — LIDOCAINE HCL (CARDIAC) PF 100 MG/5ML IV SOSY
PREFILLED_SYRINGE | INTRAVENOUS | Status: DC | PRN
  Administered 2022-06-30: 60 mg via INTRAVENOUS

## 2022-06-30 MED ORDER — ONDANSETRON HCL 4 MG/2ML IJ SOLN: 4.0000 mg | Freq: Once | INTRAMUSCULAR | Status: DC | PRN

## 2022-06-30 MED ORDER — OXYCODONE HCL 5 MG/5ML PO SOLN: 5.0000 mg | Freq: Once | ORAL | Status: DC | PRN

## 2022-06-30 MED ORDER — FENTANYL CITRATE (PF) 100 MCG/2ML IJ SOLN
INTRAMUSCULAR | Status: AC
  Filled 2022-06-30: qty 2

## 2022-06-30 MED ORDER — PROPOFOL 10 MG/ML IV BOLUS
INTRAVENOUS | Status: AC
  Filled 2022-06-30: qty 20

## 2022-06-30 MED ORDER — FENTANYL CITRATE (PF) 250 MCG/5ML IJ SOLN
INTRAMUSCULAR | Status: AC
  Filled 2022-06-30: qty 5

## 2022-06-30 MED ORDER — KETAMINE HCL 10 MG/ML IJ SOLN
INTRAMUSCULAR | Status: DC | PRN
  Administered 2022-06-30 (×5): 10 mg via INTRAVENOUS

## 2022-06-30 MED ORDER — KETAMINE HCL 50 MG/5ML IJ SOSY
PREFILLED_SYRINGE | INTRAMUSCULAR | Status: AC
  Filled 2022-06-30: qty 5

## 2022-06-30 MED ORDER — ONDANSETRON HCL 4 MG/2ML IJ SOLN
4.0000 mg | Freq: Four times a day (QID) | INTRAMUSCULAR | Status: DC | PRN
  Administered 2022-06-30 – 2022-07-01 (×2): 4 mg via INTRAVENOUS

## 2022-06-30 MED ORDER — IBUPROFEN 600 MG PO TABS: 600.0000 mg | ORAL_TABLET | Freq: Four times a day (QID) | ORAL | 1 refills | Status: AC | PRN

## 2022-06-30 MED ORDER — GABAPENTIN 300 MG PO CAPS
300.0000 mg | ORAL_CAPSULE | ORAL | Status: AC
  Administered 2022-06-30: 300 mg via ORAL

## 2022-06-30 MED ORDER — SODIUM CHLORIDE 0.9 % IV SOLN
2.0000 g | INTRAVENOUS | Status: AC
  Administered 2022-06-30: 2 g via INTRAVENOUS

## 2022-06-30 MED ORDER — KETOROLAC TROMETHAMINE 30 MG/ML IJ SOLN
INTRAMUSCULAR | Status: DC | PRN
  Administered 2022-06-30: 30 mg via INTRAVENOUS

## 2022-06-30 MED ORDER — AMISULPRIDE (ANTIEMETIC) 5 MG/2ML IV SOLN
INTRAVENOUS | Status: AC
  Filled 2022-06-30: qty 4

## 2022-06-30 MED ORDER — IBUPROFEN 200 MG PO TABS
ORAL_TABLET | ORAL | Status: AC
  Filled 2022-06-30: qty 3

## 2022-06-30 MED ORDER — ACETAMINOPHEN 500 MG PO TABS
1000.0000 mg | ORAL_TABLET | Freq: Four times a day (QID) | ORAL | Status: DC
  Administered 2022-06-30 – 2022-07-01 (×3): 1000 mg via ORAL

## 2022-06-30 MED ORDER — LIDOCAINE HCL (PF) 2 % IJ SOLN
INTRAMUSCULAR | Status: AC
  Filled 2022-06-30: qty 15

## 2022-06-30 MED ORDER — ROCURONIUM BROMIDE 10 MG/ML (PF) SYRINGE
PREFILLED_SYRINGE | INTRAVENOUS | Status: AC
  Filled 2022-06-30: qty 10

## 2022-06-30 MED ORDER — KETOROLAC TROMETHAMINE 30 MG/ML IJ SOLN: 30.0000 mg | Freq: Once | INTRAMUSCULAR | Status: DC

## 2022-06-30 MED ORDER — SENNA 8.6 MG PO TABS
1.0000 | ORAL_TABLET | Freq: Two times a day (BID) | ORAL | Status: DC
  Administered 2022-06-30: 8.6 mg via ORAL

## 2022-06-30 MED ORDER — OXYCODONE HCL 5 MG PO TABS: 5.0000 mg | ORAL_TABLET | Freq: Four times a day (QID) | ORAL | 0 refills | Status: AC | PRN

## 2022-06-30 MED ORDER — EPHEDRINE 5 MG/ML INJ
INTRAVENOUS | Status: AC
  Filled 2022-06-30: qty 5

## 2022-06-30 MED ORDER — GABAPENTIN 100 MG PO CAPS
100.0000 mg | ORAL_CAPSULE | Freq: Two times a day (BID) | ORAL | Status: DC
  Administered 2022-06-30: 100 mg via ORAL

## 2022-06-30 MED ORDER — GABAPENTIN 300 MG PO CAPS
ORAL_CAPSULE | ORAL | Status: AC
  Filled 2022-06-30: qty 1

## 2022-06-30 MED ORDER — ACETAMINOPHEN 500 MG PO TABS
1000.0000 mg | ORAL_TABLET | ORAL | Status: AC
  Administered 2022-06-30: 1000 mg via ORAL

## 2022-06-30 MED ORDER — SENNA 8.6 MG PO TABS
ORAL_TABLET | ORAL | Status: AC
  Filled 2022-06-30: qty 1

## 2022-06-30 MED ORDER — HYDROMORPHONE HCL 1 MG/ML IJ SOLN
0.2500 mg | INTRAMUSCULAR | Status: DC | PRN
  Administered 2022-06-30 (×2): 0.25 mg via INTRAVENOUS

## 2022-06-30 MED ORDER — AMISULPRIDE (ANTIEMETIC) 5 MG/2ML IV SOLN
10.0000 mg | Freq: Once | INTRAVENOUS | Status: AC | PRN
  Administered 2022-06-30: 10 mg via INTRAVENOUS

## 2022-06-30 MED ORDER — SIMETHICONE 80 MG PO CHEW: 80.0000 mg | CHEWABLE_TABLET | Freq: Four times a day (QID) | ORAL | Status: DC | PRN

## 2022-06-30 MED ORDER — PHENYLEPHRINE HCL (PRESSORS) 10 MG/ML IV SOLN
INTRAVENOUS | Status: DC | PRN
  Administered 2022-06-30 (×2): 80 ug via INTRAVENOUS

## 2022-06-30 MED ORDER — KETOROLAC TROMETHAMINE 30 MG/ML IJ SOLN
INTRAMUSCULAR | Status: AC
  Filled 2022-06-30: qty 1

## 2022-06-30 MED ORDER — DEXAMETHASONE SODIUM PHOSPHATE 10 MG/ML IJ SOLN
INTRAMUSCULAR | Status: AC
  Filled 2022-06-30: qty 1

## 2022-06-30 MED ORDER — OXYCODONE HCL 5 MG PO TABS
5.0000 mg | ORAL_TABLET | ORAL | Status: DC | PRN
  Administered 2022-06-30: 10 mg via ORAL
  Administered 2022-06-30: 5 mg via ORAL

## 2022-06-30 MED ORDER — GABAPENTIN 100 MG PO CAPS
ORAL_CAPSULE | ORAL | Status: AC
  Filled 2022-06-30: qty 1

## 2022-06-30 MED ORDER — ACETAMINOPHEN 500 MG PO TABS: 1000.0000 mg | ORAL_TABLET | Freq: Three times a day (TID) | ORAL | 0 refills | Status: AC | PRN

## 2022-06-30 MED ORDER — ACETAMINOPHEN 10 MG/ML IV SOLN: 1000.0000 mg | Freq: Once | INTRAVENOUS | Status: DC | PRN

## 2022-06-30 MED ORDER — EPHEDRINE SULFATE (PRESSORS) 50 MG/ML IJ SOLN
INTRAMUSCULAR | Status: DC | PRN
  Administered 2022-06-30: 10 mg via INTRAVENOUS
  Administered 2022-06-30: 15 mg via INTRAVENOUS

## 2022-06-30 MED ORDER — MIDAZOLAM HCL 2 MG/2ML IJ SOLN
INTRAMUSCULAR | Status: AC
  Filled 2022-06-30: qty 2

## 2022-06-30 MED ORDER — ROCURONIUM BROMIDE 100 MG/10ML IV SOLN
INTRAVENOUS | Status: DC | PRN
  Administered 2022-06-30: 10 mg via INTRAVENOUS
  Administered 2022-06-30: 60 mg via INTRAVENOUS
  Administered 2022-06-30 (×2): 10 mg via INTRAVENOUS

## 2022-06-30 MED ORDER — ALUM & MAG HYDROXIDE-SIMETH 200-200-20 MG/5ML PO SUSP: 30.0000 mL | ORAL | Status: DC | PRN

## 2022-06-30 MED ORDER — LIDOCAINE IN D5W 4-5 MG/ML-% IV SOLN
INTRAVENOUS | Status: DC | PRN
  Administered 2022-06-30: 1.5 ug/kg/min via INTRAVENOUS

## 2022-06-30 MED ORDER — HEMOSTATIC AGENTS (NO CHARGE) OPTIME
TOPICAL | Status: DC | PRN
  Administered 2022-06-30 (×2): 1

## 2022-06-30 MED ORDER — IBUPROFEN 200 MG PO TABS
600.0000 mg | ORAL_TABLET | Freq: Four times a day (QID) | ORAL | Status: DC
  Administered 2022-06-30 – 2022-07-01 (×3): 600 mg via ORAL

## 2022-06-30 SURGICAL SUPPLY — 66 items
APPLICATOR ARISTA FLEXITIP XL (MISCELLANEOUS) IMPLANT
BARRIER ADHS 3X4 INTERCEED (GAUZE/BANDAGES/DRESSINGS) IMPLANT
BLADE SURG SZ10 CARB STEEL (BLADE) IMPLANT
CATH FOLEY 3WAY  5CC 16FR (CATHETERS) ×1
CATH FOLEY 3WAY 5CC 16FR (CATHETERS) ×1 IMPLANT
CNTNR URN SCR LID CUP LEK RST (MISCELLANEOUS) IMPLANT
CONT SPEC 4OZ STRL OR WHT (MISCELLANEOUS) ×1
COVER BACK TABLE 60X90IN (DRAPES) ×1 IMPLANT
COVER TIP SHEARS 8 DVNC (MISCELLANEOUS) ×1 IMPLANT
COVER TIP SHEARS 8MM DA VINCI (MISCELLANEOUS) ×1
DEFOGGER SCOPE WARMER CLEARIFY (MISCELLANEOUS) ×1 IMPLANT
DERMABOND ADVANCED .7 DNX12 (GAUZE/BANDAGES/DRESSINGS) ×1 IMPLANT
DILATOR CANAL MILEX (MISCELLANEOUS) ×1 IMPLANT
DRAPE ARM DVNC X/XI (DISPOSABLE) ×4 IMPLANT
DRAPE COLUMN DVNC XI (DISPOSABLE) ×1 IMPLANT
DRAPE DA VINCI XI ARM (DISPOSABLE) ×4
DRAPE DA VINCI XI COLUMN (DISPOSABLE) ×1
DRAPE UTILITY 15X26 TOWEL STRL (DRAPES) ×1 IMPLANT
DURAPREP 26ML APPLICATOR (WOUND CARE) ×1 IMPLANT
ELECT REM PT RETURN 9FT ADLT (ELECTROSURGICAL) ×1
ELECTRODE REM PT RTRN 9FT ADLT (ELECTROSURGICAL) ×1 IMPLANT
GLOVE BIOGEL M 6.5 STRL (GLOVE) ×3 IMPLANT
GLOVE BIOGEL M 7.0 STRL (GLOVE) ×3 IMPLANT
GLOVE BIOGEL PI IND STRL 6.5 (GLOVE) ×6 IMPLANT
GLOVE BIOGEL PI IND STRL 7.0 (GLOVE) ×6 IMPLANT
HEMOSTAT ARISTA ABSORB 3G PWDR (HEMOSTASIS) IMPLANT
HOLDER FOLEY CATH W/STRAP (MISCELLANEOUS) IMPLANT
IRRIG SUCT STRYKERFLOW 2 WTIP (MISCELLANEOUS) ×1
IRRIGATION SUCT STRKRFLW 2 WTP (MISCELLANEOUS) ×1 IMPLANT
IV NS 1000ML (IV SOLUTION) ×1
IV NS 1000ML BAXH (IV SOLUTION) IMPLANT
KIT TURNOVER CYSTO (KITS) ×1 IMPLANT
LEGGING LITHOTOMY PAIR STRL (DRAPES) ×1 IMPLANT
MANIFOLD NEPTUNE II (INSTRUMENTS) ×1 IMPLANT
OBTURATOR OPTICAL STANDARD 8MM (TROCAR) ×1
OBTURATOR OPTICAL STND 8 DVNC (TROCAR) ×1
OBTURATOR OPTICALSTD 8 DVNC (TROCAR) ×1 IMPLANT
OCCLUDER COLPOPNEUMO (BALLOONS) ×1 IMPLANT
PACK ROBOT WH (CUSTOM PROCEDURE TRAY) ×1 IMPLANT
PACK ROBOTIC GOWN (GOWN DISPOSABLE) ×1 IMPLANT
PACK TRENDGUARD 450 HYBRID PRO (MISCELLANEOUS) IMPLANT
PAD OB MATERNITY 4.3X12.25 (PERSONAL CARE ITEMS) ×1 IMPLANT
PAD PREP 24X48 CUFFED NSTRL (MISCELLANEOUS) ×1 IMPLANT
SEAL CANN UNIV 5-8 DVNC XI (MISCELLANEOUS) ×4 IMPLANT
SEAL XI 5MM-8MM UNIVERSAL (MISCELLANEOUS) ×4
SEALER VESSEL DA VINCI XI (MISCELLANEOUS) ×1
SEALER VESSEL EXT DVNC XI (MISCELLANEOUS) IMPLANT
SET IRRIG Y TYPE TUR BLADDER L (SET/KITS/TRAYS/PACK) IMPLANT
SET TRI-LUMEN FLTR TB AIRSEAL (TUBING) ×1 IMPLANT
SPIKE FLUID TRANSFER (MISCELLANEOUS) ×2 IMPLANT
SURGIFLO W/THROMBIN 8M KIT (HEMOSTASIS) IMPLANT
SUT VIC AB 0 CT1 27 (SUTURE)
SUT VIC AB 0 CT1 27XBRD ANBCTR (SUTURE) ×2 IMPLANT
SUT VIC AB 3-0 CT1 27 (SUTURE) ×1
SUT VIC AB 3-0 CT1 TAPERPNT 27 (SUTURE) IMPLANT
SUT VIC AB 4-0 PS2 18 (SUTURE) IMPLANT
SUT VICRYL 0 UR6 27IN ABS (SUTURE) IMPLANT
SUT VICRYL RAPIDE 4/0 PS 2 (SUTURE) ×3 IMPLANT
SUT VLOC 180 0 6IN GS21 (SUTURE) IMPLANT
SUT VLOC 180 0 9IN  GS21 (SUTURE) ×1
SUT VLOC 180 0 9IN GS21 (SUTURE) ×1 IMPLANT
TIP UTERINE 6.7X8CM BLUE DISP (MISCELLANEOUS) IMPLANT
TOWEL OR 17X26 10 PK STRL BLUE (TOWEL DISPOSABLE) ×1 IMPLANT
TRENDGUARD 450 HYBRID PRO PACK (MISCELLANEOUS) ×1
TROCAR PORT AIRSEAL 8X120 (TROCAR) ×1 IMPLANT
WATER STERILE IRR 500ML POUR (IV SOLUTION) IMPLANT

## 2022-06-30 NOTE — Progress Notes (Signed)
Has continued to C/O pain and nausea. Medicated for pain via IV and then vomited 10-15 mins later. Becomes very pale and nauseated with any attempt to sit or stand. Dr, Landry Mellow aware of situation earlier.  Medicated for N/V, repositioned on side and encouraged to try and sleep.

## 2022-06-30 NOTE — H&P (Signed)
Date of Initial H&P: 06/29/2022  History reviewed, patient examined, no change in status, stable for surgery.

## 2022-06-30 NOTE — Progress Notes (Signed)
On arrival to [facility] attempted to walk to bathroom. Became dizzy and nauseous. Sat in chair. Color pale. Moved to bed, meds given. 1550 Color improving, nausea resolved. Resting comfortably.

## 2022-06-30 NOTE — Transfer of Care (Signed)
Immediate Anesthesia Transfer of Care Note  Patient: [name]  Procedure(s) Performed: Procedure(s) (LRB): XI ROBOTIC ASSISTED LAPAROSCOPIC HYSTERECTOMY AND BILATERAL SALPINGECTOMY (Bilateral)  Patient Location: PACU  Anesthesia Type: General  Level of Consciousness: awake, oriented, sedated and patient cooperative  Airway & Oxygen Therapy: Patient Spontanous Breathing and Patient connected to face mask oxygen  Post-op Assessment: Report given to PACU RN and Post -op Vital signs reviewed and stable  Post vital signs: Reviewed and stable  Complications: No apparent anesthesia complications Last Vitals:  Vitals Value Taken Time  BP 133/87 06/30/22 1456  Temp 36.5 C 06/30/22 1456  Pulse 84 06/30/22 1457  Resp 16 06/30/22 1457  SpO2 100 % 06/30/22 1457  Vitals shown include unvalidated device data.  Last Pain:  Vitals:   06/30/22 1020  TempSrc: Oral  PainSc: 3       Patients Stated Pain Goal: 7 (34/74/25 9563)  Complications: No notable events documented.

## 2022-06-30 NOTE — Op Note (Signed)
06/30/2022  3:30 PM  PATIENT:  Monica Bennett  51 y.o. female  PRE-OPERATIVE DIAGNOSIS:  Abnormal Uterine Bleeding Uterine Fibroid  POST-OPERATIVE DIAGNOSIS:  Abnormal Uterine BleedingUterine Fibroid  PROCEDURE:  Procedure(s): XI ROBOTIC ASSISTED LAPAROSCOPIC HYSTERECTOMY AND BILATERAL SALPINGECTOMY (Bilateral)  SURGEON:  Surgeon(s) and Role:    Christophe Louis, MD - Primary  PHYSICIAN ASSISTANT:   ASSISTANTS: Gaylord Shih RNFA assisted due to complexity of the anatomy     ANESTHESIA:   general  EBL:  50 mL   BLOOD ADMINISTERED:none  DRAINS: Urinary Catheter (Foley)   LOCAL MEDICATIONS USED:  OTHER ropivicaine  SPECIMEN:  Source of Specimen:  uterus cervix and bilateral fallopian tubes   DISPOSITION OF SPECIMEN:  PATHOLOGY  COUNTS:  YES  TOURNIQUET:  * No tourniquets in log *  DICTATION: .Note written in EPIC  PLAN OF CARE: Admit for overnight observation  PATIENT DISPOSITION:  PACU - hemodynamically stable.   Delay start of Pharmacological VTE agent (>24hrs) due to surgical blood loss or risk of bleeding: not applicable   Findings: enlarged fibroid uterus. Normal appearing fallopian tubes and ovaries.   Procedure: The patient was taken to the operating room #5 [facility] where she was placed under general anesthesia. Time out was performed. Marland Kitchen She was placed in dorsal lithotomy position and prepped and draped in the usual sterile fashion. A weighted speculum was placed into the vagina. A Deaver was placed anteriorly for retraction. The anterior lip of the cervix was grasped with a single-tooth tenaculum. The vaginal mucosa was injected with 2.5 cc of ropivacaine at the 2/4/ 8 and 10 o'clock positions. The uterus was sounded to 9 cm. the cervix was dilated to 6 mm . 0 vicryl suture placed at the 12 and 6:00 positions Of the cervix to facilitate placement of a Ru mi uterine manipulator. The manipulator was placed without difficulty. Weighted speculum and Deaver were removed .   Attention was turned to the patient's abdomen where a 8 mm trocar was placed at the umbilicus. under direct visualization . The pneumoperitoneum was achieved with PCO2 gas. The laparoscope was removed. 60 cc of ropivacaine were injected into the abdominal cavity. The laparoscope was reinserted. An 8 mm trocar was placed in the right upper quadrant 16 centimeters from the umbilicus.later connected to robotic arm #4). An 8MM incision was made in the Right upper quadrant TROCAR WAS PLACED 8 cm from the umbilicus. Later connected to robotic arm #3. An 8 mm incision was made in the left upper quadrant 16 cm from the umbilicus and connected to robot arm #1. Marland Kitchen Attention was turned to the left upper quadrant where a 8 mm midclavicular assistant trocar was placed. ( All incision sites were injected with 10cc of ropivacaine prior to port placement. )  Once all ports had been placed under direct visualization.The laparoscope was removed and the [place] robotic system was thin right-sided docked. The robotic arms were connected to the corresponding trocars as listed above. The laparoscope was then reinserted. The long tip bipolar forceps were placed into port #1. The prograsp placed in the port #4. A vessel sealer (alternating with scissors) was placed in port #3. All instruments were directed into the pelvis under direct visualization.   Attention was turned to the surgeons console.. The left mesosalpinx and left utero-ovarian ligament was cauterized and transected with the vessel sealer The broad ligament was cauterized and transected with the vessel sealer .The round ligament was cauterized and transected with the vessel sealer  The anterior leaf of broad ligament was incised along the bladder reflection to the midline.  The right  mesosalpinx and right utero-ovarian ligament was cauterized and transected with the vessel sealer. The right broad ligament was cauterized and transected with the vessel sealer. The right  round ligament was cauterized and transected with the vessel sealer The broad ligament was incised to the midline. The bladder was dissected off the lower uterine segments of the cervix via sharp and blunt dissection.   The uterine arteries were skeleton bilaterally. They were  cauterized and transected with the vessel sealer The KOH ring was identified. The anterior colpotomy was performed followed by the posterior colpotomy. Once the uterus,cervix and bilateral fallopian tubes were completely excised was removed through the vagina. Due to the size of the uterus it was morcellated in the vagina to facilitate removal. A vaginal laceration was noted at the time of specimen removal. This was reapproximated with 3-0 vicryl. Surgiflo was placed along the vaginal mucosa due to small vaginal abrasions.   The  bipolar forceps and scissors were removed and cobra forceps were placed in the port #1 and the mega needle driver was placed in to port #3.  The vaginal cuff was closed with running suture if 0 v-lock. The pelvis was irrigated. Marland KitchenMarland KitchenMarland KitchenExcellent hemostasis was noted. Arista was placed along the vaginal cuff.  All pelvic pedicles were examined and hemostasis was noted.   All instruments removed from the ports. All ports were removed under direct Visualization. The pneumoperitoneum was released. The skin incisions were closed with 4-0 Vicryl and then covered with Derma bond.     Sponge lap and needle counts weIre correct x. The patient was awakened from anesthesia and taken to the recovery room in stable condition.

## 2022-06-30 NOTE — Progress Notes (Signed)
Patient became nauseas, light headed, and pale during ambulation to bathroom.  RN's assisted patient to bed.

## 2022-06-30 NOTE — Anesthesia Procedure Notes (Signed)
Procedure Name: Intubation Date/Time: 06/30/2022 12:17 PM  Performed by: Justice Rocher, CRNAPre-anesthesia Checklist: Patient identified, Emergency Drugs available, Suction available, Patient being monitored and Timeout performed Patient Re-evaluated:Patient Re-evaluated prior to induction Oxygen Delivery Method: Circle system utilized Preoxygenation: Pre-oxygenation with 100% oxygen Induction Type: IV induction Ventilation: Mask ventilation without difficulty Laryngoscope Size: Mac and 3 Grade View: Grade II Tube type: Oral Tube size: 7.0 mm Number of attempts: 1 Airway Equipment and Method: Stylet and Oral airway Placement Confirmation: ETT inserted through vocal cords under direct vision, positive ETCO2, breath sounds checked- equal and bilateral and CO2 detector Secured at: 22 cm Tube secured with: Tape Dental Injury: Teeth and Oropharynx as per pre-operative assessment

## 2022-07-01 ENCOUNTER — Encounter (HOSPITAL_BASED_OUTPATIENT_CLINIC_OR_DEPARTMENT_OTHER): Payer: Self-pay | Admitting: Obstetrics and Gynecology

## 2022-07-01 DIAGNOSIS — N939 Abnormal uterine and vaginal bleeding, unspecified: Secondary | ICD-10-CM | POA: Diagnosis not present

## 2022-07-01 LAB — HEMOGLOBIN: Hemoglobin: 12.1 g/dL (ref 12.0–15.0)

## 2022-07-01 MED ORDER — ONDANSETRON HCL 4 MG/2ML IJ SOLN
INTRAMUSCULAR | Status: AC
  Filled 2022-07-01: qty 2

## 2022-07-01 MED ORDER — IBUPROFEN 200 MG PO TABS
ORAL_TABLET | ORAL | Status: AC
  Filled 2022-07-01: qty 3

## 2022-07-01 MED ORDER — ACETAMINOPHEN 500 MG PO TABS
ORAL_TABLET | ORAL | Status: AC
  Filled 2022-07-01: qty 2

## 2022-07-01 NOTE — Discharge Summary (Signed)
Physician Discharge Summary  Patient ID: Monica Bennett MRN: [id number] DOB/AGE: [id number] 52 y.o.  Admit date: 06/30/2022 Discharge date: 07/01/2022  Admission Diagnoses: Abnormal uterine bleeding Fibroid uterus  Discharge Diagnoses:  Principal Problem:   Fibroids Abnormal uterine bleeding  Procedure(s): Robotic Assisted Total Laparoscopic Hysterectomy and Bilateral Salpingectomy  Discharged Condition: good  Hospital Course: Patient was admitted on 06/30/2022 for the above named procedure(s) for the above named diagnoses. Prior to hospital discharge, patient was tolerating PO, ambulating, voiding spontaneously, passing flatus, and pain was well-controlled. See hospital chart for specific details. Patient was discharged home in stable condition.  Consults:  None  Significant Diagnostic Studies: labs:     Latest Ref Rng & Units 07/01/2022    7:04 AM 06/28/2022    9:08 AM 01/10/2022    3:48 PM  CBC  WBC 4.0 - 10.5 K/uL  4.4  5.1   Hemoglobin 12.0 - 15.0 g/dL 12.1  12.9  11.3   Hematocrit 36.0 - 46.0 %  39.8  35.6   Platelets 150 - 400 K/uL  PLATELET CLUMPS NOTED ON SMEAR, UNABLE TO ESTIMATE  271      Treatments: surgery: As above  Discharge Exam: Blood pressure 124/78, pulse 88, temperature 98.1 F (36.7 C), resp. rate 16, height '5\' 6"'$  (1.676 m), weight 80.8 kg, SpO2 99 %. Gen:  NAD, pleasant and cooperative Pulm:  No increased work of breathing Abd:  Soft, non-distended, non-tender throughout, no rebound/guarding Ext:  No bilateral LE edema, no bilateral calf tenderness  Disposition: Discharge disposition: 01-Home or Self Care       Discharge Instructions     Call MD for:  persistant nausea and vomiting   Complete by: As directed    Call MD for:  redness, tenderness, or signs of infection (pain, swelling, redness, odor or green/yellow discharge around incision site)   Complete by: As directed    Call MD for:  severe uncontrolled pain   Complete by: As  directed    Call MD for:  temperature >100.4   Complete by: As directed    Diet - low sodium heart healthy   Complete by: As directed    Driving Restrictions   Complete by: As directed    Avoid driving for 1 week   Increase activity slowly   Complete by: As directed    Lifting restrictions   Complete by: As directed    Avoid lifting over 10 lbs   May shower / Bathe   Complete by: As directed    May walk up steps   Complete by: As directed    No wound care   Complete by: As directed    Sexual Activity Restrictions   Complete by: As directed    Avoid sexual activity for 6 weeks and until approved by Dr. Landry Mellow      Allergies as of 07/01/2022   No Known Allergies      Medication List     TAKE these medications    acetaminophen 500 MG tablet Commonly known as: TYLENOL Take 2 tablets (1,000 mg total) by mouth every 8 (eight) hours as needed.   diphenhydramine-acetaminophen 25-500 MG Tabs tablet Commonly known as: TYLENOL PM Take 1 tablet by mouth at bedtime as needed.   ibuprofen 600 MG tablet Commonly known as: ADVIL Take 1 tablet (600 mg total) by mouth every 6 (six) hours as needed. What changed:  medication strength how much to take additional instructions   oxyCODONE 5 MG immediate release tablet Commonly  known as: Oxy IR/ROXICODONE Take 1-2 tablets (5-10 mg total) by mouth every 6 (six) hours as needed for moderate pain.   pantoprazole 40 MG tablet Commonly known as: PROTONIX TAKE 1 TABLET (40 MG TOTAL) BY MOUTH DAILY. TAKE 30-60 MIN BEFORE FIRST MEAL OF THE DAY   vitamin C 1000 MG tablet Take 1,000 mg by mouth daily.        Follow-up Information     Christophe Louis, MD. Go in 2 week(s).   Specialty: Obstetrics and Gynecology Contact information: [address] [place] [address] [phone]                 Signed: Drema Dallas 07/01/2022, 7:41 AM

## 2022-07-01 NOTE — Anesthesia Postprocedure Evaluation (Signed)
Anesthesia Post Note  Patient: Gwenevere Goga  Procedure(s) Performed: XI ROBOTIC ASSISTED LAPAROSCOPIC HYSTERECTOMY AND BILATERAL SALPINGECTOMY (Bilateral: Abdomen)     Patient location during evaluation: PACU Anesthesia Type: General Level of consciousness: awake and alert Pain management: pain level controlled Vital Signs Assessment: post-procedure vital signs reviewed and stable Respiratory status: spontaneous breathing, nonlabored ventilation, respiratory function stable and patient connected to nasal cannula oxygen Cardiovascular status: blood pressure returned to baseline and stable Postop Assessment: no apparent nausea or vomiting Anesthetic complications: no  No notable events documented.  Last Vitals:  Vitals:   07/01/22 0234 07/01/22 0545  BP: 126/79 124/78  Pulse: 87 88  Resp: 16 16  Temp: 36.7 C 36.7 C  SpO2: 98% 99%    Last Pain:  Vitals:   07/01/22 0545  TempSrc:   PainSc: [place]

## 2022-07-02 LAB — SURGICAL PATHOLOGY

## 2023-05-11 ENCOUNTER — Other Ambulatory Visit: Payer: Self-pay | Admitting: Obstetrics and Gynecology

## 2023-05-11 DIAGNOSIS — Z1231 Encounter for screening mammogram for malignant neoplasm of breast: Secondary | ICD-10-CM

## 2023-05-13 ENCOUNTER — Ambulatory Visit
Admission: RE | Admit: 2023-05-13 | Discharge: 2023-05-13 | Disposition: A | Discharge status: Home / Self Care | Payer: 59 | Source: Ambulatory Visit | Attending: Obstetrics and Gynecology | Admitting: Obstetrics and Gynecology

## 2023-05-13 DIAGNOSIS — Z1231 Encounter for screening mammogram for malignant neoplasm of breast: Secondary | ICD-10-CM

## 2024-05-01 ENCOUNTER — Other Ambulatory Visit: Payer: Self-pay | Admitting: Obstetrics and Gynecology

## 2024-05-01 DIAGNOSIS — Z1231 Encounter for screening mammogram for malignant neoplasm of breast: Secondary | ICD-10-CM

## 2024-05-04 DIAGNOSIS — Z1231 Encounter for screening mammogram for malignant neoplasm of breast: Secondary | ICD-10-CM

## 2024-05-25 ENCOUNTER — Ambulatory Visit
Admission: RE | Admit: 2024-05-25 | Discharge: 2024-05-25 | Disposition: A | Discharge status: Home / Self Care | Payer: Self-pay | Source: Ambulatory Visit

## 2024-05-25 DIAGNOSIS — Z1231 Encounter for screening mammogram for malignant neoplasm of breast: Secondary | ICD-10-CM
